# Patient Record
Sex: Female | Born: 1990 | Race: White | Hispanic: No | Marital: Married | Smoking: Never smoker
Health system: Southern US, Community
[De-identification: ages and names within clinical notes are randomized; demographics above are authoritative.]

## PROBLEM LIST (undated history)

## (undated) ENCOUNTER — Inpatient Hospital Stay (HOSPITAL_COMMUNITY): Payer: Self-pay

## (undated) DIAGNOSIS — Z8619 Personal history of other infectious and parasitic diseases: Secondary | ICD-10-CM

## (undated) DIAGNOSIS — N059 Unspecified nephritic syndrome with unspecified morphologic changes: Secondary | ICD-10-CM

## (undated) DIAGNOSIS — N189 Chronic kidney disease, unspecified: Secondary | ICD-10-CM

## (undated) DIAGNOSIS — I10 Essential (primary) hypertension: Secondary | ICD-10-CM

## (undated) DIAGNOSIS — K219 Gastro-esophageal reflux disease without esophagitis: Secondary | ICD-10-CM

## (undated) DIAGNOSIS — O139 Gestational [pregnancy-induced] hypertension without significant proteinuria, unspecified trimester: Secondary | ICD-10-CM

## (undated) HISTORY — PX: MOUTH SURGERY: SHX715

## (undated) HISTORY — DX: Personal history of other infectious and parasitic diseases: Z86.19

## (undated) HISTORY — DX: Gastro-esophageal reflux disease without esophagitis: K21.9

## (undated) HISTORY — PX: ELBOW SURGERY: SHX618

---
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
## 2011-11-11 ENCOUNTER — Emergency Department (HOSPITAL_COMMUNITY): Payer: Self-pay

## 2011-11-11 ENCOUNTER — Emergency Department (HOSPITAL_COMMUNITY)
Admission: EM | Admit: 2011-11-11 | Discharge: 2011-11-11 | Disposition: A | Discharge status: Home / Self Care | Payer: Self-pay | Attending: Emergency Medicine | Admitting: Emergency Medicine

## 2011-11-11 ENCOUNTER — Encounter (HOSPITAL_COMMUNITY): Payer: Self-pay | Admitting: Emergency Medicine

## 2011-11-11 DIAGNOSIS — B2790 Infectious mononucleosis, unspecified without complication: Secondary | ICD-10-CM | POA: Insufficient documentation

## 2011-11-11 LAB — CBC
HCT: 40.8 % (ref 36.0–46.0)
Hemoglobin: 14.8 g/dL (ref 12.0–15.0)
MCH: 31.9 pg (ref 26.0–34.0)
MCHC: 36.3 g/dL — ABNORMAL HIGH (ref 30.0–36.0)
MCV: 87.9 fL (ref 78.0–100.0)
RDW: 12.5 % (ref 11.5–15.5)

## 2011-11-11 LAB — DIFFERENTIAL
Basophils Relative: 4 % — ABNORMAL HIGH (ref 0–1)
Eosinophils Absolute: 0 10*3/uL (ref 0.0–0.7)
Lymphs Abs: 6.6 10*3/uL — ABNORMAL HIGH (ref 0.7–4.0)
Monocytes Absolute: 1.7 10*3/uL — ABNORMAL HIGH (ref 0.1–1.0)
Neutro Abs: 4.3 10*3/uL (ref 1.7–7.7)

## 2011-11-11 LAB — MONONUCLEOSIS SCREEN: Mono Screen: POSITIVE — AB

## 2011-11-11 MED ORDER — MAGIC MOUTHWASH: ORAL | Status: DC

## 2011-11-11 MED ORDER — HYDROCODONE-ACETAMINOPHEN 7.5-500 MG/15ML PO SOLN
10.0000 mL | Freq: Once | ORAL | Status: AC
  Administered 2011-11-11: 10 mL via ORAL
  Filled 2011-11-11: qty 15

## 2011-11-11 MED ORDER — IOHEXOL 300 MG/ML  SOLN
100.0000 mL | Freq: Once | INTRAMUSCULAR | Status: AC | PRN
  Administered 2011-11-11: 100 mL via INTRAVENOUS

## 2011-11-11 MED ORDER — HYDROCODONE-ACETAMINOPHEN 7.5-500 MG/15ML PO SOLN: ORAL | Status: DC

## 2011-11-11 NOTE — ED Notes (Signed)
Water given to pt 

## 2011-11-11 NOTE — ED Notes (Signed)
Pt c/o sore throat and throat edema x 1 week. Seen and given PCN, pt states it is not helping.

## 2011-11-12 NOTE — ED Provider Notes (Signed)
History     CSN: [id number]  Arrival date & time 11/11/11  1124   First MD Initiated Contact with Patient 11/11/11 1131      Chief Complaint  Patient presents with  . Abscess    (Consider location/radiation/quality/duration/timing/severity/associated sxs/prior treatment) HPI Comments: Patient c/o persistent sore throat and swollen glands to her neck for one week.  States she was seen at a local "minute clinic" at [facility] and told she had "strep throat" and started on PCN.  States she has taken it for several days but her symptoms are not improving.  She also c/o decreased appetite and fatigue.  She denies fever or vomting  Patient is a 21 y.o. female presenting with pharyngitis. The history is provided by the patient. No language interpreter was used.  Sore Throat This is a new problem. The current episode started 1 to 4 weeks ago. The problem occurs constantly. The problem has been gradually worsening. Associated symptoms include fatigue, neck pain, a sore throat and swollen glands. Pertinent negatives include no abdominal pain, arthralgias, chills, congestion, coughing, fever, headaches, nausea, rash, visual change, vomiting or weakness. The symptoms are aggravated by swallowing, drinking and eating. Treatments tried: oral antibiotics. The treatment provided no relief.    History reviewed. No pertinent past medical history.  Past Surgical History  Procedure Date  . Elbow surgery     Family History  Problem Relation Age of Onset  . Hypertension Mother     History  Substance Use Topics  . Smoking status: Never Smoker   . Smokeless tobacco: Never Used  . Alcohol Use: Yes     occas    OB History    Grav Para Term Preterm Abortions TAB SAB Ect Mult Living                  Review of Systems  Constitutional: Positive for appetite change and fatigue. Negative for fever and chills.  HENT: Positive for sore throat, trouble swallowing and neck pain. Negative for ear pain,  congestion, facial swelling, drooling, neck stiffness and dental problem.   Respiratory: Negative for cough.   Gastrointestinal: Negative for nausea, vomiting, abdominal pain and abdominal distention.  Musculoskeletal: Negative for arthralgias.  Skin: Negative.  Negative for rash.  Neurological: Negative for dizziness, weakness and headaches.  Hematological: Positive for adenopathy.  All other systems reviewed and are negative.    Allergies  Review of patient's allergies indicates no known allergies.  Home Medications   Current Outpatient Rx  Name Route Sig Dispense Refill  . PENICILLIN V POTASSIUM 500 MG PO TABS Oral Take 500 mg by mouth 3 (three) times daily. Started on 11/05/11 to take for 10 days.    Marland Kitchen MAGIC MOUTHWASH  5 ml po swish and spit TID prn 60 mL 0  . HYDROCODONE-ACETAMINOPHEN 7.5-500 MG/15ML PO SOLN  10 ml po q 4-6 hrs prn pain 150 mL 0    BP 131/90  Pulse 103  Temp(Src) 98 F (36.7 C) (Oral)  Resp 16  Ht 5\' 4"  (1.626 m)  Wt 172 lb (78.019 kg)  BMI 29.52 kg/m2  SpO2 99%  LMP 10/28/2011  Physical Exam  Nursing note and vitals reviewed. Constitutional: She is oriented to person, place, and time. She appears well-developed and well-nourished. No distress.  HENT:  Head: Normocephalic and atraumatic. No trismus in the jaw.  Right Ear: Tympanic membrane and ear canal normal.  Left Ear: Tympanic membrane and ear canal normal.  Mouth/Throat: Uvula is midline and mucous  membranes are normal. No uvula swelling or dental caries. Posterior oropharyngeal edema and posterior oropharyngeal erythema present. No oropharyngeal exudate.       Oropharyngeal erythema bilaterally with enlarged left tonsil and mild swelling or the left soft palate.    Neck: No thyromegaly present.  Cardiovascular: Normal rate, regular rhythm and normal heart sounds.   No murmur heard. Pulmonary/Chest: Effort normal and breath sounds normal. No stridor.  Abdominal: Soft. Normal appearance. She  exhibits no distension. There is no hepatosplenomegaly. There is no tenderness. There is no rebound.  Lymphadenopathy:    She has cervical adenopathy.       Right cervical: Superficial cervical and deep cervical adenopathy present. No posterior cervical adenopathy present.      Left cervical: Superficial cervical and deep cervical adenopathy present. No posterior cervical adenopathy present.       Right: No supraclavicular adenopathy present.       Left: No supraclavicular adenopathy present.  Neurological: She is alert and oriented to person, place, and time. She exhibits normal muscle tone. Coordination normal.  Skin: Skin is warm and dry.    ED Course  Procedures (including critical care time)  Labs Reviewed  CBC - Abnormal; Notable for the following:    WBC 13.1 (*)    MCHC 36.3 (*)    All other components within normal limits  DIFFERENTIAL - Abnormal; Notable for the following:    Neutrophils Relative 33 (*)    Lymphocytes Relative 50 (*)    Monocytes Relative 13 (*)    Basophils Relative 4 (*)    Lymphs Abs 6.6 (*)    Monocytes Absolute 1.7 (*)    Basophils Absolute 0.5 (*)    All other components within normal limits  MONONUCLEOSIS SCREEN - Abnormal; Notable for the following:    Mono Screen POSITIVE (*)    All other components within normal limits  LAB REPORT - SCANNED   Ct Soft Tissue Neck W Contrast  11/11/2011  *RADIOLOGY REPORT*  Clinical Data: Sore throat and edema for 1 week, worse on the left. No apparent response to antibiotics.  CT NECK WITH CONTRAST  Technique:  Multidetector CT imaging of the neck was performed with intravenous contrast.  Contrast: OMNIPAQUE IOHEXOL 300 MG/ML IV SOLN  Comparison: None.  Findings: Suprahyoid neck:  Enlarged left greater than right lingual tonsils.  The left lingual tonsil is larger than the right, measuring 18 x 24 x 39 mm.  No peritonsillar abscess.  Mild deformity of the airway but no impending compromise. Prominent but  symmetric nasopharyngeal adenoidal enlargement.  No evidence for eustachian tube dysfunction/mastoid fluid. The right submandibular gland appears slightly enlarged but I see no calculus.  Normal parotid glands.  No mucosal lesion.  Larynx:  Normal.  Infrahyoid neck:  Unremarkable thyroid.  No neck masses other than adenopathy.  Trachea midline.  Lymph nodes:  Widespread but relatively symmetric level I, II, III, IV and V adenopathy. There is one level II lymph node on the right with a short axis measurement of 11 mm, but most  display short axis measurements of 8 mm or less.  Upper chest/mediastinum:  No mediastinal masses or adenopathy. Normal great vessels.  Lung apices clear without infiltrate or nodule.  No visible effusion.  Additional:  Visualized intracranial compartment unremarkable.  No visible sinus disease skull base intact.  IMPRESSION: Findings consistent with significant tonsillitis, worst involving the left lingual tonsil.  No peritonsillar abscess or impending airway obstruction. No evidence for eustachian  tube dysfunction.  Widespread cervical lymphadenopathy with no apparent involvement of the mediastinum. These could be reactive from pharyngitis/tonsillitis, or might represent a different process such as mononucleosis. Neoplasm felt unlikely, but further investigation may be warranted if nodal enlargement persists or progresses.  Slight chronic submandibular gland  enlargement without visible calculi; significance uncertain.  Original Report Authenticated By: Elsie Stain, M.D.     1. Mononucleosis       MDM     Patient has significantly enlarged, bilateral anterior cervical lymph nodes. Erythema and edema of the left tonsil with mild edema of the peritonsillar region.  Handle own secretions w/o difficulty, no trismus.  Non-toxic appearing.    1150  Patient was seen by EDP and care plan was discussed.  Will order CT scan of neck for possible peritonsillar abscess.    Patient is  feeling better, pain improved.  HAs drank fluids w/o difficulty.  I have advised her to avoid abdominal injury, rest,fluids and f/u with her PMD.  She agrees to care plan.  Patient / Family / Caregiver understand and agree with initial ED impression and plan with expectations set for ED visit.   Pt feels improved after observation and/or treatment in ED.   Pt stable in ED with no significant deterioration in condition.        Twain [address], [place] 11/12/11 2052

## 2011-11-17 NOTE — ED Provider Notes (Signed)
Medical screening examination/treatment/procedure(s) were performed by non-physician practitioner and as supervising physician I was immediately available for consultation/collaboration.  Nicoletta Dress. Colon Branch, MD 11/17/11 1010

## 2014-10-27 NOTE — L&D Delivery Note (Signed)
SVD of VMI at 2107 on 08/20/15.  APGARs 9,9.  EBL 350cc.  Placenta to L&D. Head delivered LOA and body followed atraumatically.  Baby to abdomen.  Cord was clamped and cut.  Cord blood was obtained.  Placenta delivered S/I/3VC. Fundus was firmed with pitocin and massage.  Vaginal laceration and bilateral periurethral lacs were repaired with 3-0 Rapide in the normal fashion.  Mom and baby stable.  Mitchel HonourMegan Kimiah Hibner, DO

## 2015-01-12 ENCOUNTER — Encounter: Payer: Self-pay | Admitting: Adult Health

## 2015-02-05 LAB — OB RESULTS CONSOLE RUBELLA ANTIBODY, IGM: Rubella: IMMUNE

## 2015-02-05 LAB — OB RESULTS CONSOLE ABO/RH: RH TYPE: POSITIVE

## 2015-02-05 LAB — OB RESULTS CONSOLE HIV ANTIBODY (ROUTINE TESTING): HIV: NONREACTIVE

## 2015-02-05 LAB — OB RESULTS CONSOLE RPR: RPR: NONREACTIVE

## 2015-02-05 LAB — OB RESULTS CONSOLE HEPATITIS B SURFACE ANTIGEN: Hepatitis B Surface Ag: NEGATIVE

## 2015-02-05 LAB — OB RESULTS CONSOLE ANTIBODY SCREEN: Antibody Screen: NEGATIVE

## 2015-02-06 ENCOUNTER — Inpatient Hospital Stay (HOSPITAL_COMMUNITY): Admission: AD | Admit: 2015-02-06 | Payer: Self-pay | Source: Ambulatory Visit | Admitting: Obstetrics and Gynecology

## 2015-04-17 LAB — OB RESULTS CONSOLE GC/CHLAMYDIA
Chlamydia: POSITIVE
Gonorrhea: NEGATIVE

## 2015-06-26 ENCOUNTER — Encounter (HOSPITAL_COMMUNITY): Payer: Self-pay | Admitting: *Deleted

## 2015-06-26 ENCOUNTER — Inpatient Hospital Stay (HOSPITAL_COMMUNITY)
Admission: AD | Admit: 2015-06-26 | Discharge: 2015-06-26 | Disposition: A | Discharge status: Home / Self Care | Payer: PRIVATE HEALTH INSURANCE | Source: Ambulatory Visit | Attending: Obstetrics and Gynecology | Admitting: Obstetrics and Gynecology

## 2015-06-26 DIAGNOSIS — N949 Unspecified condition associated with female genital organs and menstrual cycle: Secondary | ICD-10-CM | POA: Diagnosis not present

## 2015-06-26 DIAGNOSIS — Z3A29 29 weeks gestation of pregnancy: Secondary | ICD-10-CM | POA: Insufficient documentation

## 2015-06-26 DIAGNOSIS — R102 Pelvic and perineal pain: Secondary | ICD-10-CM | POA: Diagnosis not present

## 2015-06-26 DIAGNOSIS — O26893 Other specified pregnancy related conditions, third trimester: Secondary | ICD-10-CM | POA: Diagnosis not present

## 2015-06-26 DIAGNOSIS — R109 Unspecified abdominal pain: Secondary | ICD-10-CM | POA: Diagnosis present

## 2015-06-26 LAB — URINALYSIS, ROUTINE W REFLEX MICROSCOPIC
BILIRUBIN URINE: NEGATIVE
GLUCOSE, UA: NEGATIVE mg/dL
Ketones, ur: NEGATIVE mg/dL
Leukocytes, UA: NEGATIVE
NITRITE: NEGATIVE
PH: 7 (ref 5.0–8.0)
Protein, ur: 100 mg/dL — AB
SPECIFIC GRAVITY, URINE: 1.01 (ref 1.005–1.030)
Urobilinogen, UA: 0.2 mg/dL (ref 0.0–1.0)

## 2015-06-26 LAB — URINE MICROSCOPIC-ADD ON

## 2015-06-26 NOTE — MAU Provider Note (Signed)
History     CSN: [id number]  Arrival date and time: 06/26/15 2131   First Provider Initiated Contact with Patient 06/26/15 2232      No chief complaint on file.  HPI  Meghan Calhoun is a 24 y.o. G1P0 at [redacted]w[redacted]d who presents to MAU today with complaint of lower abdominal tightening and mucus discharge. She states that she notes the tightening 2-3 times per hour with standing and/or ambulation. She does not feel much pain or tightening when resting. She denies vaginal bleeding or LOF. She does report a mucus discharge x 2-3 days. She denies foul odor or color. She denies complications with the pregnancy. She reports good fetal movement.   OB History    Gravida Para Term Preterm AB TAB SAB Ectopic Multiple Living   1               History reviewed. No pertinent past medical history.  Past Surgical History  Procedure Laterality Date  . Elbow surgery    . Mouth surgery      History reviewed. No pertinent family history.  Social History  Substance Use Topics  . Smoking status: Never Smoker   . Smokeless tobacco: None  . Alcohol Use: No    Allergies: No Known Allergies  No prescriptions prior to admission    Review of Systems  Constitutional: Negative for fever and malaise/fatigue.  Gastrointestinal: Negative for nausea, vomiting, abdominal pain, diarrhea and constipation.  Genitourinary: Negative for dysuria, urgency and frequency.       Neg - vaginal bleeding, abnormal discharge, LOF   Physical Exam   Blood pressure 122/77, pulse 95, temperature 98.4 F (36.9 C), temperature source Oral, resp. rate 16, height  (1.651 m), weight 186 lb (84.369 kg), SpO2 100 %.  Physical Exam  Nursing note and vitals reviewed. Constitutional: She is oriented to person, place, and time. She appears well-developed and well-nourished. No distress.  HENT:  Head: Normocephalic and atraumatic.  Cardiovascular: Normal rate.   Respiratory: Effort normal.  GI: Soft. She exhibits no  distension and no mass. There is no tenderness. There is no rebound and no guarding.  Neurological: She is alert and oriented to person, place, and time.  Skin: Skin is warm and dry. No erythema.  Psychiatric: She has a normal mood and affect.  Dilation: Closed Effacement (%): Thick Station: Ballotable Exam by:: Harlon Flor, PA-C  Results for orders placed or performed during the hospital encounter of 06/26/15 (from the past 24 hour(s))  Urinalysis, Routine w reflex microscopic (not at [facility])     Status: Abnormal   Collection Time: 06/26/15 10:42 PM  Result Value Ref Range   Color, Urine YELLOW YELLOW   APPearance CLEAR CLEAR   Specific Gravity, Urine 1.010 1.005 - 1.030   pH 7.0 5.0 - 8.0   Glucose, UA NEGATIVE NEGATIVE mg/dL   Hgb urine dipstick TRACE (A) NEGATIVE   Bilirubin Urine NEGATIVE NEGATIVE   Ketones, ur NEGATIVE NEGATIVE mg/dL   Protein, ur 409 (A) NEGATIVE mg/dL   Urobilinogen, UA 0.2 0.0 - 1.0 mg/dL   Nitrite NEGATIVE NEGATIVE   Leukocytes, UA NEGATIVE NEGATIVE  Urine microscopic-add on     Status: Abnormal   Collection Time: 06/26/15 10:42 PM  Result Value Ref Range   Squamous Epithelial / LPF FEW (A) RARE   WBC, UA 3-6 <3 WBC/hpf   Bacteria, UA FEW (A) RARE    Fetal Monitoring: Baseline: 140 bpm, moderate variability, + accelerations, one variable deceleration noted  in 40 minutes Contractions: none  MAU Course  Procedures None  MDM UA today Discussed with Dr. Henderson Cloud. Agrees with plan for counseling on round ligament pain and discharge at this time. Follow-up as scheduled.   Assessment and Plan  A: [facility]UP at [redacted]w[redacted]d Round ligament pain  P: Discharge home Tylenol advised PRN for pain Discussed abdominal binder and warm bath/shower for pain Preterm labor precautions discussed Patient advised to follow-up with [facility] as scheduled for routine prenatal care or sooner PRN Patient may return to MAU as needed or if her condition were to change  or worsen   Marny Lowenstein, PA-C  06/26/2015, 11:19 PM

## 2015-06-26 NOTE — Discharge Instructions (Signed)

## 2015-06-26 NOTE — MAU Note (Signed)
E Signature page not working in room 3.  Patient signed paper copy.

## 2015-06-26 NOTE — MAU Note (Signed)
Pt reports for the last 2-3 days she has had a mucous and ? Contractions.

## 2015-07-05 ENCOUNTER — Encounter (HOSPITAL_COMMUNITY): Payer: Self-pay

## 2015-07-05 ENCOUNTER — Encounter (HOSPITAL_COMMUNITY): Payer: Self-pay | Admitting: Emergency Medicine

## 2015-07-11 ENCOUNTER — Observation Stay (HOSPITAL_COMMUNITY)
Admission: AD | Admit: 2015-07-11 | Discharge: 2015-07-13 | Disposition: A | Discharge status: Home / Self Care | Payer: PRIVATE HEALTH INSURANCE | Source: Ambulatory Visit | Attending: Obstetrics & Gynecology | Admitting: Obstetrics & Gynecology

## 2015-07-11 ENCOUNTER — Encounter (HOSPITAL_COMMUNITY): Payer: Self-pay | Admitting: *Deleted

## 2015-07-11 DIAGNOSIS — O1403 Mild to moderate pre-eclampsia, third trimester: Secondary | ICD-10-CM | POA: Diagnosis not present

## 2015-07-11 DIAGNOSIS — O1493 Unspecified pre-eclampsia, third trimester: Secondary | ICD-10-CM | POA: Diagnosis present

## 2015-07-11 DIAGNOSIS — Z3A31 31 weeks gestation of pregnancy: Secondary | ICD-10-CM | POA: Insufficient documentation

## 2015-07-11 DIAGNOSIS — O1490 Unspecified pre-eclampsia, unspecified trimester: Secondary | ICD-10-CM | POA: Diagnosis present

## 2015-07-11 LAB — COMPREHENSIVE METABOLIC PANEL
ALBUMIN: 1.7 g/dL — AB (ref 3.5–5.0)
ALK PHOS: 91 U/L (ref 38–126)
ALT: 13 U/L — ABNORMAL LOW (ref 14–54)
ANION GAP: 11 (ref 5–15)
AST: 18 U/L (ref 15–41)
BUN: 6 mg/dL (ref 6–20)
CO2: 21 mmol/L — AB (ref 22–32)
Calcium: 7.6 mg/dL — ABNORMAL LOW (ref 8.9–10.3)
Chloride: 99 mmol/L — ABNORMAL LOW (ref 101–111)
Creatinine, Ser: 0.45 mg/dL (ref 0.44–1.00)
GFR calc Af Amer: >60 mL/min (ref >60)
GFR calc non Af Amer: >60 mL/min (ref >60)
GLUCOSE: 92 mg/dL (ref 65–99)
POTASSIUM: 3.4 mmol/L — AB (ref 3.5–5.1)
SODIUM: 131 mmol/L — AB (ref 135–145)
Total Bilirubin: 0.3 mg/dL (ref 0.3–1.2)
Total Protein: 5.9 g/dL — ABNORMAL LOW (ref 6.5–8.1)

## 2015-07-11 LAB — PROTEIN / CREATININE RATIO, URINE
Creatinine, Urine: 109 mg/dL
PROTEIN CREATININE RATIO: 14.25 mg/mg{creat} — AB (ref 0.00–0.15)
Total Protein, Urine: 1553 mg/dL

## 2015-07-11 LAB — URINALYSIS, ROUTINE W REFLEX MICROSCOPIC
Bilirubin Urine: NEGATIVE
Glucose, UA: NEGATIVE mg/dL
Ketones, ur: 40 mg/dL — AB
LEUKOCYTES UA: NEGATIVE
NITRITE: NEGATIVE
Specific Gravity, Urine: 1.02 (ref 1.005–1.030)
UROBILINOGEN UA: 0.2 mg/dL (ref 0.0–1.0)
pH: 7.5 (ref 5.0–8.0)

## 2015-07-11 LAB — CBC
HCT: 36.1 % (ref 36.0–46.0)
HEMOGLOBIN: 13.1 g/dL (ref 12.0–15.0)
MCH: 32.8 pg (ref 26.0–34.0)
MCHC: 36.3 g/dL — AB (ref 30.0–36.0)
MCV: 90.5 fL (ref 78.0–100.0)
Platelets: 253 10*3/uL (ref 150–400)
RBC: 3.99 MIL/uL (ref 3.87–5.11)
RDW: 13.1 % (ref 11.5–15.5)
WBC: 12.7 10*3/uL — AB (ref 4.0–10.5)

## 2015-07-11 LAB — FETAL FIBRONECTIN: FETAL FIBRONECTIN: NEGATIVE

## 2015-07-11 LAB — URINE MICROSCOPIC-ADD ON

## 2015-07-11 MED ORDER — BETAMETHASONE SOD PHOS & ACET 6 (3-3) MG/ML IJ SUSP
12.0000 mg | INTRAMUSCULAR | Status: AC
  Administered 2015-07-11 – 2015-07-12 (×2): 12 mg via INTRAMUSCULAR
  Filled 2015-07-11 (×2): qty 2

## 2015-07-11 MED ORDER — CALCIUM CARBONATE ANTACID 500 MG PO CHEW: 2.0000 | CHEWABLE_TABLET | ORAL | Status: DC | PRN

## 2015-07-11 MED ORDER — DOCUSATE SODIUM 100 MG PO CAPS
100.0000 mg | ORAL_CAPSULE | Freq: Every day | ORAL | Status: DC
  Administered 2015-07-12: 100 mg via ORAL
  Filled 2015-07-11: qty 1

## 2015-07-11 MED ORDER — TERBUTALINE SULFATE 1 MG/ML IJ SOLN
0.2500 mg | Freq: Once | INTRAMUSCULAR | Status: AC
  Administered 2015-07-11: 0.25 mg via SUBCUTANEOUS
  Filled 2015-07-11: qty 1

## 2015-07-11 MED ORDER — ACETAMINOPHEN 325 MG PO TABS: 650.0000 mg | ORAL_TABLET | ORAL | Status: DC | PRN

## 2015-07-11 MED ORDER — PRENATAL MULTIVITAMIN CH
1.0000 | ORAL_TABLET | Freq: Every day | ORAL | Status: DC
  Administered 2015-07-12: 1 via ORAL
  Filled 2015-07-11: qty 1

## 2015-07-11 MED ORDER — ZOLPIDEM TARTRATE 5 MG PO TABS: 5.0000 mg | ORAL_TABLET | Freq: Every evening | ORAL | Status: DC | PRN

## 2015-07-11 NOTE — H&P (Signed)
Meghan Calhoun is a 24 y.o. female presenting to MAU for evaluation of contractions.  The patient reports nausea, vomiting, and diarrhea today.  She started feeling CTX this pm and was concerned for labor but her cvx was closed with neg FFN.  The patient's only antepartum issue has been the development of proteinuria over the last few weeks (3+); negative urine cx.  The patient's BPs have been normal until tonight in MAU where they were mostly mild range.  Preeclampsia labs wnl but P:C 14.  The patient denies HA, CP/SOB, RUQ pain, and visual disturbance.  Active FM.  CTX are better after receiving one dose of terbutaline.  Maternal Medical History:  Reason for admission: Contractions and nausea.   Contractions: Onset was 6-12 hours ago.   Frequency: irregular.   Perceived severity is mild.    Fetal activity: Perceived fetal activity is normal.   Last perceived fetal movement was within the past hour.    Prenatal complications: Pre-eclampsia.   Prenatal Complications - Diabetes: none.    OB History    Gravida Para Term Preterm AB TAB SAB Ectopic Multiple Living   1 0 0 0 0 0 0 0       History reviewed. No pertinent past medical history. Past Surgical History  Procedure Laterality Date  . Elbow surgery    . Mouth surgery     Family History: family history includes Hypertension in her mother. Social History:  reports that she has never smoked. She does not have any smokeless tobacco history on file. She reports that she does not drink alcohol or use illicit drugs.   Prenatal Transfer Tool  Maternal Diabetes: No Genetic Screening: Normal Maternal Ultrasounds/Referrals: Normal Fetal Ultrasounds or other Referrals:  None Maternal Substance Abuse:  No Significant Maternal Medications:  None Significant Maternal Lab Results:  None Other Comments:  None  Review of Systems  Constitutional: Negative for fever and chills.  Eyes: Negative for blurred vision and double vision.   Respiratory: Negative for shortness of breath.   Cardiovascular: Positive for leg swelling. Negative for chest pain and palpitations.  Gastrointestinal: Positive for nausea, vomiting, abdominal pain and diarrhea.  Neurological: Negative for dizziness and headaches.    Dilation: Closed Effacement (%): 20 Station: [place], -3 Exam by:: Artelia Laroche, CNM Blood pressure 143/91, pulse 116, temperature 98.7 F (37.1 C), temperature source Oral, resp. rate 18, height  (1.651 m), weight 186 lb (84.369 kg), SpO2 100 %. Maternal Exam:  Abdomen: Patient reports no abdominal tenderness. Fundal height is c/w dates.   Estimated fetal weight is 4#.       Physical Exam  Constitutional: She is oriented to person, place, and time. She appears well-developed and well-nourished.  GI: Soft. There is no rebound and no guarding.  Musculoskeletal: She exhibits edema.  Neurological: She is alert and oriented to person, place, and time.  Skin: Skin is warm and dry.  Psychiatric: She has a normal mood and affect. Her behavior is normal.  Ext: 1+DTRs, no clonus, 1+edema in LLE   Prenatal labs: ABO, Rh: B/Positive/-- (04/11 0000) Antibody: Negative (04/11 0000) Rubella: Immune (04/11 0000) RPR: Nonreactive (04/11 0000)  HBsAg: Negative (04/11 0000)  HIV: Non-reactive (04/11 0000)  GBS:     Assessment/Plan: 24yo G1 at [redacted]w[redacted]d with pre-eclampsia without severe features -BMZ -Serial BPs -Repeat labs in AM -MFM U/S and consult in AM -Collect 24 hour urine -Monitor for development of severe features  Meghan Calhoun 07/11/2015, 9:54 PM

## 2015-07-11 NOTE — MAU Note (Signed)
Sudden onset of pain all over abdomen around 1530, then vomited X 1. Pain less after that but has moved to her back. Vomited X 1 yesterday. Some loose stools past 3 days. Denies cramping or pain with that.

## 2015-07-11 NOTE — MAU Provider Note (Signed)
History     CSN: [id number]  Arrival date and time: 07/11/15 1749   First Provider Initiated Contact with Patient 07/11/15 1839      Chief Complaint  Patient presents with  . Abdominal Pain   This is a 24 y.o. female at [redacted]w[redacted]d who presents with c/o contractions since this afternoon. Denies leaking or bleeding. Reports + FM. Told RN she had vomiting and loose stools. I informed her of elevated BPs today and she states they have been watching her for elevated proteinuria "lately".  But has had no hypertension until today. Denies headache or blurred vision.   Patient is a 24 y.o. female presenting with abdominal pain and hypertension. The history is provided by the patient.  Abdominal Pain The primary symptoms of the illness include abdominal pain, vomiting and diarrhea (not really diarrhea, but loose). The primary symptoms of the illness do not include fever, fatigue, nausea, dysuria, vaginal discharge or vaginal bleeding.  The patient states that she believes she is currently pregnant. Additional symptoms associated with the illness include back pain. Symptoms associated with the illness do not include chills, anorexia, heartburn, constipation, urgency, hematuria or frequency.  Hypertension Associated symptoms include abdominal pain and vomiting. Pertinent negatives include no anorexia, chills, fatigue, fever or nausea.   RN Note: Sudden onset of pain all over abdomen around 1530, then vomited X 1. Pain less after that but has moved to her back. Vomited X 1 yesterday. Some loose stools past 3 days. Denies cramping or pain with that.          OB History    Gravida Para Term Preterm AB TAB SAB Ectopic Multiple Living   1 0 0 0 0 0 0 0        History reviewed. No pertinent past medical history.  Past Surgical History  Procedure Laterality Date  . Elbow surgery    . Mouth surgery      Family History  Problem Relation Age of Onset  . Hypertension Mother     Social History   Substance Use Topics  . Smoking status: Never Smoker   . Smokeless tobacco: None  . Alcohol Use: No     Comment: occas    Allergies: No Known Allergies  Prescriptions prior to admission  Medication Sig Dispense Refill Last Dose  . Alum & Mag Hydroxide-Simeth (MAGIC MOUTHWASH) SOLN 5 ml po swish and spit TID prn 60 mL 0   . HYDROcodone-acetaminophen (LORTAB) 7.5-500 MG/15ML solution 10 ml po q 4-6 hrs prn pain 150 mL 0   . penicillin v potassium (VEETID) 500 MG tablet Take 500 mg by mouth 3 (three) times daily. Started on 11/05/11 to take for 10 days.   11/10/2011  . Prenatal Vit-Fe Fumarate-FA (PRENATAL MULTIVITAMIN) TABS tablet Take 1 tablet by mouth daily at 12 noon.   06/26/2015 at Unknown time   Medical, Surgical, Family and Social histories reviewed and are listed above.  Medications and allergies reviewed.   Review of Systems  Constitutional: Negative for fever, chills, malaise/fatigue and fatigue.  Gastrointestinal: Positive for vomiting, abdominal pain and diarrhea (not really diarrhea, but loose). Negative for heartburn, nausea, constipation and anorexia.  Genitourinary: Negative for dysuria, urgency, frequency, hematuria, vaginal bleeding and vaginal discharge.  Musculoskeletal: Positive for back pain.  Neurological: Negative for focal weakness.  Other systems negative  Physical Exam   Blood pressure 141/92, pulse 110, temperature 98.1 F (36.7 C), temperature source Oral, resp. rate 18.  Physical Exam  Constitutional: She is  oriented to person, place, and time. She appears well-developed and well-nourished. No distress.  Cardiovascular: Regular rhythm.   No murmur (mild tachycardia) heard. Respiratory: Effort normal. No respiratory distress.  GI: Soft.  Genitourinary:  Dilation: Closed Effacement (%): 20 Station: [place], -3 Exam by:: Artelia Laroche, CNM  FHR reactive Uterine irritability with contractions every 1.5-2 min   Musculoskeletal: Normal range of  motion. She exhibits no edema.  Neurological: She is alert and oriented to person, place, and time. She has normal reflexes. She exhibits normal muscle tone.  Skin: Skin is warm and dry.  Psychiatric: She has a normal mood and affect.   Results for orders placed or performed during the hospital encounter of 07/11/15 (from the past 24 hour(s))  Urinalysis, Routine w reflex microscopic (not at [facility])     Status: Abnormal   Collection Time: 07/11/15  6:04 PM  Result Value Ref Range   Color, Urine YELLOW YELLOW   APPearance CLEAR CLEAR   Specific Gravity, Urine 1.020 1.005 - 1.030   pH 7.5 5.0 - 8.0   Glucose, UA NEGATIVE NEGATIVE mg/dL   Hgb urine dipstick TRACE (A) NEGATIVE   Bilirubin Urine NEGATIVE NEGATIVE   Ketones, ur 40 (A) NEGATIVE mg/dL   Protein, ur >409 (A) NEGATIVE mg/dL   Urobilinogen, UA 0.2 0.0 - 1.0 mg/dL   Nitrite NEGATIVE NEGATIVE   Leukocytes, UA NEGATIVE NEGATIVE  Protein / creatinine ratio, urine     Status: Abnormal   Collection Time: 07/11/15  6:04 PM  Result Value Ref Range   Creatinine, Urine 109.00 mg/dL   Total Protein, Urine 1553 mg/dL   Protein Creatinine Ratio 14.25 (H) 0.00 - 0.15 mg/mg[Cre]  Urine microscopic-add on     Status: Abnormal   Collection Time: 07/11/15  6:04 PM  Result Value Ref Range   Squamous Epithelial / LPF FEW (A) RARE   WBC, UA 3-6 <3 WBC/hpf   RBC / HPF 0-2 <3 RBC/hpf   Bacteria, UA FEW (A) RARE   Casts HYALINE CASTS (A) NEGATIVE  Fetal fibronectin     Status: None   Collection Time: 07/11/15  6:50 PM  Result Value Ref Range   Fetal Fibronectin NEGATIVE NEGATIVE  CBC     Status: Abnormal   Collection Time: 07/11/15  7:15 PM  Result Value Ref Range   WBC 12.7 (H) 4.0 - 10.5 K/uL   RBC 3.99 3.87 - 5.11 MIL/uL   Hemoglobin 13.1 12.0 - 15.0 g/dL   HCT 81.1 91.4 - 78.2 %   MCV 90.5 78.0 - 100.0 fL   MCH 32.8 26.0 - 34.0 pg   MCHC 36.3 (H) 30.0 - 36.0 g/dL   RDW 95.6 21.3 - 08.6 %   Platelets 253 150 - 400 K/uL   Comprehensive metabolic panel     Status: Abnormal   Collection Time: 07/11/15  7:15 PM  Result Value Ref Range   Sodium 131 (L) 135 - 145 mmol/L   Potassium 3.4 (L) 3.5 - 5.1 mmol/L   Chloride 99 (L) 101 - 111 mmol/L   CO2 21 (L) 22 - 32 mmol/L   Glucose, Bld 92 65 - 99 mg/dL   BUN 6 6 - 20 mg/dL   Creatinine, Ser 5.78 0.44 - 1.00 mg/dL   Calcium 7.6 (L) 8.9 - 10.3 mg/dL   Total Protein 5.9 (L) 6.5 - 8.1 g/dL   Albumin 1.7 (L) 3.5 - 5.0 g/dL   AST 18 15 - 41 U/L   ALT 13 (L) 14 -  54 U/L   Alkaline Phosphatase 91 38 - 126 U/L   Total Bilirubin 0.3 0.3 - 1.2 mg/dL   GFR calc non Af Amer >60 >60 mL/min   GFR calc Af Amer >60 >60 mL/min   Anion gap 11 5 - 15    MAU Course  Procedures  MDM Consulted Dr Langston Masker with presentation, symptoms, exam findings. She ordered Terbutaline, FFn, and PIH labs.  Fetal fibronectin sent to rule out Preterm labor   >>>   Negative  Terbutaline ordered  >> good cessation of contractions  CBC, CMET, and Pr/Creat Ratio ordered to rule out preeclampsia  2023: D/W Dr. Langston Masker, will obs for tonight and give BMZ    Assessment and Plan  A:  SIUP at [redacted]w[redacted]d        Preterm uterine contractions with negative fetal fibronectin and resolution of contractions       Gestational hypertension, rule out preeclampsia        Very high protein creatinine ratio       Reactive fetal heart rate tracing  P:  Consulted Dr Langston Masker re: exam findings and results. She recommends admission       Transfer to antenatal unit for Observation, 24 hr Urine and steroids       Preeclampsia precautions  Tawnya Crook  8:27 PM 07/11/2015   Wynelle Bourgeois 07/11/2015, 6:58 PM

## 2015-07-12 ENCOUNTER — Observation Stay (HOSPITAL_COMMUNITY): Payer: PRIVATE HEALTH INSURANCE

## 2015-07-12 DIAGNOSIS — O1403 Mild to moderate pre-eclampsia, third trimester: Secondary | ICD-10-CM | POA: Diagnosis not present

## 2015-07-12 LAB — CBC
HEMATOCRIT: 37.3 % (ref 36.0–46.0)
Hemoglobin: 13.4 g/dL (ref 12.0–15.0)
MCH: 32.6 pg (ref 26.0–34.0)
MCHC: 35.9 g/dL (ref 30.0–36.0)
MCV: 90.8 fL (ref 78.0–100.0)
PLATELETS: 266 10*3/uL (ref 150–400)
RBC: 4.11 MIL/uL (ref 3.87–5.11)
RDW: 13 % (ref 11.5–15.5)
WBC: 8.2 10*3/uL (ref 4.0–10.5)

## 2015-07-12 LAB — COMPREHENSIVE METABOLIC PANEL
ALBUMIN: 1.7 g/dL — AB (ref 3.5–5.0)
ALT: 13 U/L — AB (ref 14–54)
AST: 18 U/L (ref 15–41)
Alkaline Phosphatase: 92 U/L (ref 38–126)
Anion gap: 10 (ref 5–15)
BILIRUBIN TOTAL: 0.5 mg/dL (ref 0.3–1.2)
BUN: 6 mg/dL (ref 6–20)
CHLORIDE: 101 mmol/L (ref 101–111)
CO2: 24 mmol/L (ref 22–32)
CREATININE: 0.47 mg/dL (ref 0.44–1.00)
Calcium: 8.5 mg/dL — ABNORMAL LOW (ref 8.9–10.3)
GFR calc Af Amer: >60 mL/min (ref >60)
GLUCOSE: 121 mg/dL — AB (ref 65–99)
POTASSIUM: 4.7 mmol/L (ref 3.5–5.1)
Sodium: 135 mmol/L (ref 135–145)
Total Protein: 6.2 g/dL — ABNORMAL LOW (ref 6.5–8.1)

## 2015-07-12 NOTE — Progress Notes (Signed)
Patient ID: Meghan Calhoun, female   DOB: 1991/06/20, 24 y.o.   MRN: [id number] S: NO PIH SX'S  NAUSEA AND VOMITTING RESOLVED O: BP 141/93       DTR'S 1+       GOOD FM       PIH LABS OK A: IUP AT 31.3 WITH MILD PRE-ECLAMPSIA P: SONO WITH MFM     FINISH BMZ     COMPLETE 24 HR URINE

## 2015-07-12 NOTE — Consult Note (Signed)
MATERNAL FETAL MEDICINE CONSULT  Patient Name: Meghan Calhoun Medical Record Number:  [id number] Date of Birth: 15-May-1991 Requesting Physician Name:  Mitchel Honour, DO Date of Service: 07/12/2015  Chief Complaint Elevated blood pressure  History of Present Illness Meghan Calhoun was seen today secondary to elevated BP at the request of Mitchel Honour, DO.  The patient is a 24 y.o. G1P0000,at [redacted]w[redacted]d with an EDD of 09/10/2015, Date entered prior to episode creation dating method.  She presented to the hospital for evaluation for contractions and was found to have elevated BPs.  Subsequent work up demonstrated a protein/creatinine ratio of 14.25, but no other abnormalities.  She denies headache, visual changes, RUQ pain, or an acute change in edema.  Her contractions have subsided.  She denies vaginal bleeding or loss of fluid.  She has no history of hypertension.  Review of Systems Pertinent items are noted in HPI.  Patient History OB History  Gravida Para Term Preterm AB SAB TAB Ectopic Multiple Living  1 0 0 0 0 0 0 0      # Outcome Date GA Lbr Len/2nd Weight Sex Delivery Anes PTL Lv  1 Current               History reviewed. No pertinent past medical history.  Past Surgical History  Procedure Laterality Date  . Elbow surgery    . Mouth surgery      Social History   Social History  . Marital Status: Married    Spouse Name: N/A  . Number of Children: N/A  . Years of Education: N/A   Social History Main Topics  . Smoking status: Never Smoker   . Smokeless tobacco: None  . Alcohol Use: No     Comment: occas  . Drug Use: No  . Sexual Activity: Yes    Birth Control/ Protection: None   Other Topics Concern  . None   Social History Narrative   ** Merged History Encounter **        Family History  Problem Relation Age of Onset  . Hypertension Mother    In addition, the patient has no family history of mental retardation, birth defects, or genetic diseases.  Physical  Examination Filed Vitals:   07/12/15 1209  BP: 143/90  Pulse: 100  Temp: 97.9 F (36.6 C)  Resp: 18   General appearance - alert, well appearing, and in no distress Abdomen - soft, nontender, nondistended, no masses or organomegaly Extremities - pedal edema 1 +  Assessment and Recommendations 1.  Gestational hypertension with suspected preeclampsia.  Meghan Calhoun clearly has abnormally high blood pressures.  Fortunately, they are all within the mild range.  In addition, she has no symptoms or laboratory abnormalities to suggest severe features.  Despite the very high protein/creatinine ratio of 14 Meghan Calhoun has only mild peripheral edema.  This raises the possibility of a spurious lab value.  I would confirm the presence of significant proteinuria with the 24 hour urine collection that is in process.  This is mostly an academic issue as Meghan Calhoun should be managed the same whether she has gestational hypertension or preeclampsia.  She can be expectantly managed as she has no severe features.  I recommend twice weekly fetal testing, once weekly preeclampsia labs, growth scans every 4 weeks, and delivery between 37-38 weeks.  I spent 20 minutes with Meghan Calhoun today of which 50% was face-to-face counseling.  Thank you for referring Meghan Calhoun to the [facility].  Please do not  hesitate to contact us with questions.   Rema Fendt, MD

## 2015-07-13 ENCOUNTER — Encounter (HOSPITAL_COMMUNITY)
Admission: AD | Disposition: A | Discharge status: Home / Self Care | Payer: Self-pay | Source: Ambulatory Visit | Attending: Obstetrics & Gynecology

## 2015-07-13 DIAGNOSIS — O1493 Unspecified pre-eclampsia, third trimester: Secondary | ICD-10-CM | POA: Diagnosis not present

## 2015-07-13 DIAGNOSIS — O1403 Mild to moderate pre-eclampsia, third trimester: Secondary | ICD-10-CM | POA: Diagnosis not present

## 2015-07-13 LAB — PROTEIN, URINE, 24 HOUR
Collection Interval-UPROT: 24 hours
PROTEIN, 24H URINE: 18659 mg/d — AB (ref 50–100)
PROTEIN, URINE: 1357 mg/dL
URINE TOTAL VOLUME-UPROT: 1375 mL

## 2015-07-13 SURGERY — Surgical Case
Anesthesia: Regional

## 2015-07-13 NOTE — OB Triage Note (Signed)
Discharge instructions given and discussed with patient. Patient verbalized understanding and all questions answered. Pt. Discharged home with mother-in-law.

## 2015-07-13 NOTE — Plan of Care (Signed)
Problem: Consults Goal: Birthing Suites Patient Information Press F2 to bring up selections list  Outcome: Completed/Met Date Met:  07/13/15  PIH (Pregnancy induced hypertension) and Antenatal Patient (< 37 weeks)

## 2015-07-13 NOTE — Discharge Summary (Signed)
  Admission Diagnosis: IUP at 31 weeks Preeclampsia  Discharge Diagnosis: Same  Hospital Course: 24 year old G 1 P 0 at 32 w 4 days presents to hospital for evaluation of hypertension.  Patient was admitted and was given steroids for fetal lung maturity.  She had labs which revealed normal liver function tests and normal platelet count.  Her 24 hour protein was 18 grams which classified her as preeclampsia.  Yesterday she had a consultation with MFM and they recommended bedrest as outpatient with close monitoring in our office 2 times per week.  Her blood pressure has been well controlled during her hospitalization.  She has responded to bedrest.  She was discharged home today in stable condition. She was given very strict Preeclampsia precautions. She will follow up on Monday for NST and [facility] labs. She will follow strict bedrest at home and monitor weights daily.

## 2015-08-02 ENCOUNTER — Encounter (HOSPITAL_COMMUNITY): Payer: Self-pay | Admitting: *Deleted

## 2015-08-02 ENCOUNTER — Inpatient Hospital Stay (HOSPITAL_COMMUNITY)
Admission: AD | Admit: 2015-08-02 | Discharge: 2015-08-02 | Disposition: A | Discharge status: Home / Self Care | Payer: PRIVATE HEALTH INSURANCE | Source: Ambulatory Visit | Attending: Obstetrics and Gynecology | Admitting: Obstetrics and Gynecology

## 2015-08-02 DIAGNOSIS — Z3A34 34 weeks gestation of pregnancy: Secondary | ICD-10-CM | POA: Insufficient documentation

## 2015-08-02 DIAGNOSIS — O1493 Unspecified pre-eclampsia, third trimester: Secondary | ICD-10-CM | POA: Insufficient documentation

## 2015-08-02 DIAGNOSIS — R51 Headache: Secondary | ICD-10-CM

## 2015-08-02 DIAGNOSIS — M549 Dorsalgia, unspecified: Secondary | ICD-10-CM | POA: Insufficient documentation

## 2015-08-02 DIAGNOSIS — O9989 Other specified diseases and conditions complicating pregnancy, childbirth and the puerperium: Secondary | ICD-10-CM

## 2015-08-02 DIAGNOSIS — O26893 Other specified pregnancy related conditions, third trimester: Secondary | ICD-10-CM

## 2015-08-02 DIAGNOSIS — R030 Elevated blood-pressure reading, without diagnosis of hypertension: Secondary | ICD-10-CM | POA: Diagnosis present

## 2015-08-02 LAB — COMPREHENSIVE METABOLIC PANEL
ALBUMIN: 1.5 g/dL — AB (ref 3.5–5.0)
ALK PHOS: 95 U/L (ref 38–126)
ALT: 14 U/L (ref 14–54)
AST: 13 U/L — AB (ref 15–41)
Anion gap: 7 (ref 5–15)
BILIRUBIN TOTAL: 0.6 mg/dL (ref 0.3–1.2)
BUN: 10 mg/dL (ref 6–20)
CALCIUM: 8 mg/dL — AB (ref 8.9–10.3)
CO2: 23 mmol/L (ref 22–32)
CREATININE: 0.43 mg/dL — AB (ref 0.44–1.00)
Chloride: 107 mmol/L (ref 101–111)
GFR calc Af Amer: >60 mL/min (ref >60)
GLUCOSE: 78 mg/dL (ref 65–99)
Potassium: 3.5 mmol/L (ref 3.5–5.1)
Sodium: 137 mmol/L (ref 135–145)
TOTAL PROTEIN: 5.5 g/dL — AB (ref 6.5–8.1)

## 2015-08-02 LAB — CBC
HEMATOCRIT: 36.6 % (ref 36.0–46.0)
HEMOGLOBIN: 13.1 g/dL (ref 12.0–15.0)
MCH: 32.2 pg (ref 26.0–34.0)
MCHC: 35.8 g/dL (ref 30.0–36.0)
MCV: 89.9 fL (ref 78.0–100.0)
Platelets: 243 10*3/uL (ref 150–400)
RBC: 4.07 MIL/uL (ref 3.87–5.11)
RDW: 13 % (ref 11.5–15.5)
WBC: 10.4 10*3/uL (ref 4.0–10.5)

## 2015-08-02 LAB — URINALYSIS, ROUTINE W REFLEX MICROSCOPIC
BILIRUBIN URINE: NEGATIVE
GLUCOSE, UA: NEGATIVE mg/dL
KETONES UR: 15 mg/dL — AB
NITRITE: NEGATIVE
PH: 6.5 (ref 5.0–8.0)
Protein, ur: >300 mg/dL — AB
SPECIFIC GRAVITY, URINE: 1.02 (ref 1.005–1.030)
Urobilinogen, UA: 0.2 mg/dL (ref 0.0–1.0)

## 2015-08-02 LAB — PROTEIN / CREATININE RATIO, URINE
Creatinine, Urine: 117 mg/dL
Protein Creatinine Ratio: 11.52 mg/mg{Cre} — ABNORMAL HIGH (ref 0.00–0.15)
TOTAL PROTEIN, URINE: 1348 mg/dL

## 2015-08-02 LAB — URINE MICROSCOPIC-ADD ON

## 2015-08-02 LAB — URIC ACID: URIC ACID, SERUM: 2.7 mg/dL (ref 2.3–6.6)

## 2015-08-02 MED ORDER — CYCLOBENZAPRINE HCL 10 MG PO TABS: 10.0000 mg | ORAL_TABLET | Freq: Three times a day (TID) | ORAL | Status: DC | PRN

## 2015-08-02 MED ORDER — CYCLOBENZAPRINE HCL 10 MG PO TABS
10.0000 mg | ORAL_TABLET | Freq: Once | ORAL | Status: AC
  Administered 2015-08-02: 10 mg via ORAL
  Filled 2015-08-02: qty 1

## 2015-08-02 MED ORDER — ACETAMINOPHEN 500 MG PO TABS
1000.0000 mg | ORAL_TABLET | ORAL | Status: AC
  Administered 2015-08-02: 1000 mg via ORAL
  Filled 2015-08-02: qty 2

## 2015-08-02 NOTE — Discharge Instructions (Signed)

## 2015-08-02 NOTE — MAU Provider Note (Signed)
History     CSN: [id number]  Arrival date and time: 08/02/15 1550   First Provider Initiated Contact with Patient 08/02/15 1655      Chief Complaint  Patient presents with  . Hypertension  . Headache   HPI Meghan Calhoun 24 y.o. G1P0000  presents to MAU for eval of high blood pressure, increased protein in urine and headache.  She was seen in clinic earlier today and told to come here.  Her BP has been 130s/90s today.  She has a headache, 6/10 that has been ongoing all day.  It is right sided.  Denies vision changes, nausea, vomiting, weakness, increased swelling in feet, epigastric pain.  She took Tylenol at 10:30am today for HA and it did not help.   OB History    Gravida Para Term Preterm AB TAB SAB Ectopic Multiple Living   1 0 0 0 0 0 0 0        Past Medical History  Diagnosis Date  . Medical history non-contributory     Past Surgical History  Procedure Laterality Date  . Elbow surgery    . Mouth surgery    . No past surgeries      Family History  Problem Relation Age of Onset  . Hypertension Mother     Social History  Substance Use Topics  . Smoking status: Never Smoker   . Smokeless tobacco: None  . Alcohol Use: No     Comment: occas    Allergies: No Known Allergies  Prescriptions prior to admission  Medication Sig Dispense Refill Last Dose  . acetaminophen (TYLENOL) 325 MG tablet Take 650 mg by mouth every 6 (six) hours as needed for headache.   08/02/2015 at Unknown time  . Prenatal Vit-Fe Fumarate-FA (PRENATAL MULTIVITAMIN) TABS tablet Take 1 tablet by mouth daily at 12 noon.   08/01/2015 at Unknown time    ROS Pertinent ROS in HPI.  All other systems are negative.   Physical Exam   Blood pressure 134/95, pulse 97, temperature 98.4 F (36.9 C), temperature source Oral, resp. rate 16.  Physical Exam  Constitutional: She is oriented to person, place, and time. She appears well-developed and well-nourished. No distress.  HENT:  Head:  Normocephalic and atraumatic.  Eyes: EOM are normal.  Neck: Normal range of motion.  Cardiovascular: Normal rate.   Respiratory: Effort normal. No respiratory distress.  GI: Soft. She exhibits no distension. There is no tenderness. There is no rebound and no guarding.  Musculoskeletal: Normal range of motion.  Neurological: She is alert and oriented to person, place, and time.  Skin: Skin is warm and dry.  Psychiatric: She has a normal mood and affect.   Results for orders placed or performed during the hospital encounter of 08/02/15 (from the past 24 hour(s))  Urinalysis, Routine w reflex microscopic (not at [facility])     Status: Abnormal   Collection Time: 08/02/15  4:00 PM  Result Value Ref Range   Color, Urine YELLOW YELLOW   APPearance HAZY (A) CLEAR   Specific Gravity, Urine 1.020 1.005 - 1.030   pH 6.5 5.0 - 8.0   Glucose, UA NEGATIVE NEGATIVE mg/dL   Hgb urine dipstick SMALL (A) NEGATIVE   Bilirubin Urine NEGATIVE NEGATIVE   Ketones, ur 15 (A) NEGATIVE mg/dL   Protein, ur >401 (A) NEGATIVE mg/dL   Urobilinogen, UA 0.2 0.0 - 1.0 mg/dL   Nitrite NEGATIVE NEGATIVE   Leukocytes, UA [id number]  Protein / creatinine ratio, urine  Status: Abnormal   Collection Time: 08/02/15  4:00 PM  Result Value Ref Range   Creatinine, Urine 117.00 mg/dL   Total Protein, Urine 1348 mg/dL   Protein Creatinine Ratio 11.52 (H) 0.00 - 0.15 mg/mg[Cre]  Urine microscopic-add on     Status: Abnormal   Collection Time: 08/02/15  4:00 PM  Result Value Ref Range   Squamous Epithelial / LPF MANY (A) RARE   WBC, UA 11-20 <3 WBC/hpf   RBC / HPF 3-6 <3 RBC/hpf   Bacteria, UA MANY (A) RARE  CBC     Status: None   Collection Time: 08/02/15  4:25 PM  Result Value Ref Range   WBC 10.4 4.0 - 10.5 K/uL   RBC 4.07 3.87 - 5.11 MIL/uL   Hemoglobin 13.1 12.0 - 15.0 g/dL   HCT 16.1 09.6 - 04.5 %   MCV 89.9 78.0 - 100.0 fL   MCH 32.2 26.0 - 34.0 pg   MCHC 35.8 30.0 - 36.0 g/dL   RDW 40.9 81.1 - 91.4  %   Platelets 243 150 - 400 K/uL  Comprehensive metabolic panel     Status: Abnormal   Collection Time: 08/02/15  4:25 PM  Result Value Ref Range   Sodium 137 135 - 145 mmol/L   Potassium 3.5 3.5 - 5.1 mmol/L   Chloride 107 101 - 111 mmol/L   CO2 23 22 - 32 mmol/L   Glucose, Bld 78 65 - 99 mg/dL   BUN 10 6 - 20 mg/dL   Creatinine, Ser 7.82 (L) 0.44 - 1.00 mg/dL   Calcium 8.0 (L) 8.9 - 10.3 mg/dL   Total Protein 5.5 (L) 6.5 - 8.1 g/dL   Albumin 1.5 (L) 3.5 - 5.0 g/dL   AST 13 (L) 15 - 41 U/L   ALT 14 14 - 54 U/L   Alkaline Phosphatase 95 38 - 126 U/L   Total Bilirubin 0.6 0.3 - 1.2 mg/dL   GFR calc non Af Amer >60 >60 mL/min   GFR calc Af Amer >60 >60 mL/min   Anion gap 7 5 - 15  Uric acid     Status: None   Collection Time: 08/02/15  4:25 PM  Result Value Ref Range   Uric Acid, Serum 2.7 2.3 - 6.6 mg/dL   Fetal Tracing: [id number] Variability:mod Accelerations: 15x15s Decelerations: occas variable Toco:irreg    MAU Course  Procedures  MDM Discussed with Dr. Henderson Cloud.  Labs, blood pressures and symptoms reviewed.  Need for delivery to come from symptoms and fetal monitoring - not from labs.   Per Dr. Henderson Cloud, will add Tylenol to previously ordered Flexeril for HA.  Monitor x 45 minutes and call back with progress.   Pt's HA improved from 6/10 to 3/10.   Discussed with MD.  Molli Knock to discharge pt to home with precautions.  Rx for flexeril.   Assessment and Plan  A:  1. Headache in pregnancy, third trimester   2. Preeclampsia, third trimester   3. Back pain affecting pregnancy in third trimester    P: Discharge to home Preeclampsia precautions given  Pt to f/u in clinic on Monday, 10/10.   Flexeril rx prn headache/back pain Patient may return to MAU as needed or if her condition were to change or worsen   Bertram Denver 08/02/2015, 4:56 PM

## 2015-08-02 NOTE — MAU Note (Signed)
Urine in lab 

## 2015-08-02 NOTE — MAU Note (Signed)
Sent from office for further eval.  BP elevated and has HA.  Denies swelling, visual changes or epigastric pain

## 2015-08-06 LAB — OB RESULTS CONSOLE GBS: GBS: NEGATIVE

## 2015-08-16 ENCOUNTER — Telehealth (HOSPITAL_COMMUNITY): Payer: Self-pay | Admitting: *Deleted

## 2015-08-16 ENCOUNTER — Encounter (HOSPITAL_COMMUNITY): Payer: Self-pay | Admitting: *Deleted

## 2015-08-16 NOTE — Telephone Encounter (Signed)
Preadmission screen  

## 2015-08-20 ENCOUNTER — Inpatient Hospital Stay (HOSPITAL_COMMUNITY): Payer: PRIVATE HEALTH INSURANCE | Admitting: Anesthesiology

## 2015-08-20 ENCOUNTER — Inpatient Hospital Stay (HOSPITAL_COMMUNITY)
Admission: RE | Admit: 2015-08-20 | Discharge: 2015-08-22 | DRG: 775 | Disposition: A | Discharge status: Home / Self Care | Payer: PRIVATE HEALTH INSURANCE | Source: Ambulatory Visit | Attending: Obstetrics & Gynecology | Admitting: Obstetrics & Gynecology

## 2015-08-20 ENCOUNTER — Encounter (HOSPITAL_COMMUNITY): Payer: Self-pay

## 2015-08-20 DIAGNOSIS — Z8249 Family history of ischemic heart disease and other diseases of the circulatory system: Secondary | ICD-10-CM | POA: Diagnosis not present

## 2015-08-20 DIAGNOSIS — Z3490 Encounter for supervision of normal pregnancy, unspecified, unspecified trimester: Secondary | ICD-10-CM

## 2015-08-20 DIAGNOSIS — O1494 Unspecified pre-eclampsia, complicating childbirth: Secondary | ICD-10-CM | POA: Diagnosis present

## 2015-08-20 DIAGNOSIS — Z3A37 37 weeks gestation of pregnancy: Secondary | ICD-10-CM

## 2015-08-20 DIAGNOSIS — O1490 Unspecified pre-eclampsia, unspecified trimester: Secondary | ICD-10-CM | POA: Diagnosis present

## 2015-08-20 LAB — COMPREHENSIVE METABOLIC PANEL
ALT: 13 U/L — AB (ref 14–54)
ANION GAP: 5 (ref 5–15)
AST: 15 U/L (ref 15–41)
Albumin: 1.2 g/dL — ABNORMAL LOW (ref 3.5–5.0)
Alkaline Phosphatase: 93 U/L (ref 38–126)
BUN: 6 mg/dL (ref 6–20)
CHLORIDE: 110 mmol/L (ref 101–111)
CO2: 22 mmol/L (ref 22–32)
Calcium: 7.1 mg/dL — ABNORMAL LOW (ref 8.9–10.3)
Creatinine, Ser: 0.43 mg/dL — ABNORMAL LOW (ref 0.44–1.00)
GFR calc non Af Amer: >60 mL/min (ref >60)
Glucose, Bld: 88 mg/dL (ref 65–99)
Potassium: 3.1 mmol/L — ABNORMAL LOW (ref 3.5–5.1)
SODIUM: 137 mmol/L (ref 135–145)
Total Bilirubin: 0.4 mg/dL (ref 0.3–1.2)
Total Protein: 4.5 g/dL — ABNORMAL LOW (ref 6.5–8.1)

## 2015-08-20 LAB — CBC
HCT: 33.4 % — ABNORMAL LOW (ref 36.0–46.0)
HEMATOCRIT: 34.2 % — AB (ref 36.0–46.0)
HEMOGLOBIN: 12.2 g/dL (ref 12.0–15.0)
HEMOGLOBIN: 12.4 g/dL (ref 12.0–15.0)
MCH: 32.6 pg (ref 26.0–34.0)
MCH: 32.4 pg (ref 26.0–34.0)
MCHC: 36.5 g/dL — AB (ref 30.0–36.0)
MCHC: 36.3 g/dL — ABNORMAL HIGH (ref 30.0–36.0)
MCV: 89.3 fL (ref 78.0–100.0)
MCV: 89.3 fL (ref 78.0–100.0)
Platelets: 230 10*3/uL (ref 150–400)
Platelets: 240 10*3/uL (ref 150–400)
RBC: 3.74 MIL/uL — ABNORMAL LOW (ref 3.87–5.11)
RBC: 3.83 MIL/uL — AB (ref 3.87–5.11)
RDW: 13.5 % (ref 11.5–15.5)
RDW: 13.6 % (ref 11.5–15.5)
WBC: 10.3 10*3/uL (ref 4.0–10.5)
WBC: 9.3 10*3/uL (ref 4.0–10.5)

## 2015-08-20 LAB — TYPE AND SCREEN
ABO/RH(D): B POS
Antibody Screen: NEGATIVE

## 2015-08-20 LAB — ABO/RH: ABO/RH(D): B POS

## 2015-08-20 LAB — RPR: RPR: NONREACTIVE

## 2015-08-20 MED ORDER — FENTANYL 2.5 MCG/ML BUPIVACAINE 1/10 % EPIDURAL INFUSION (WH - ANES)
14.0000 mL/h | INTRAMUSCULAR | Status: DC | PRN
  Administered 2015-08-20 (×3): 14 mL/h via EPIDURAL
  Filled 2015-08-20 (×2): qty 125

## 2015-08-20 MED ORDER — LACTATED RINGERS IV SOLN
INTRAVENOUS | Status: DC
  Administered 2015-08-20: 02:00:00 via INTRAVENOUS
  Administered 2015-08-20 (×2): 100 mL/h via INTRAVENOUS

## 2015-08-20 MED ORDER — PHENYLEPHRINE 40 MCG/ML (10ML) SYRINGE FOR IV PUSH (FOR BLOOD PRESSURE SUPPORT)
80.0000 ug | PREFILLED_SYRINGE | INTRAVENOUS | Status: DC | PRN
  Filled 2015-08-20: qty 2
  Filled 2015-08-20: qty 20

## 2015-08-20 MED ORDER — CITRIC ACID-SODIUM CITRATE 334-500 MG/5ML PO SOLN: 30.0000 mL | ORAL | Status: DC | PRN

## 2015-08-20 MED ORDER — ZOLPIDEM TARTRATE 5 MG PO TABS: 5.0000 mg | ORAL_TABLET | Freq: Every evening | ORAL | Status: DC | PRN

## 2015-08-20 MED ORDER — OXYTOCIN BOLUS FROM INFUSION: 500.0000 mL | INTRAVENOUS | Status: DC

## 2015-08-20 MED ORDER — ACETAMINOPHEN 325 MG PO TABS: 650.0000 mg | ORAL_TABLET | ORAL | Status: DC | PRN

## 2015-08-20 MED ORDER — OXYTOCIN 40 UNITS IN LACTATED RINGERS INFUSION - SIMPLE MED
62.5000 mL/h | INTRAVENOUS | Status: DC
  Filled 2015-08-20: qty 1000

## 2015-08-20 MED ORDER — DIPHENHYDRAMINE HCL 50 MG/ML IJ SOLN: 12.5000 mg | INTRAMUSCULAR | Status: DC | PRN

## 2015-08-20 MED ORDER — LIDOCAINE HCL (PF) 1 % IJ SOLN
INTRAMUSCULAR | Status: DC | PRN
  Administered 2015-08-20 (×2): 4 mL

## 2015-08-20 MED ORDER — OXYCODONE-ACETAMINOPHEN 5-325 MG PO TABS: 2.0000 | ORAL_TABLET | ORAL | Status: DC | PRN

## 2015-08-20 MED ORDER — OXYCODONE-ACETAMINOPHEN 5-325 MG PO TABS: 1.0000 | ORAL_TABLET | ORAL | Status: DC | PRN

## 2015-08-20 MED ORDER — LIDOCAINE HCL (PF) 1 % IJ SOLN
30.0000 mL | INTRAMUSCULAR | Status: DC | PRN
  Filled 2015-08-20: qty 30

## 2015-08-20 MED ORDER — TERBUTALINE SULFATE 1 MG/ML IJ SOLN
0.2500 mg | Freq: Once | INTRAMUSCULAR | Status: DC | PRN
  Filled 2015-08-20: qty 1

## 2015-08-20 MED ORDER — OXYTOCIN 40 UNITS IN LACTATED RINGERS INFUSION - SIMPLE MED
2.0000 m[IU]/min | INTRAVENOUS | Status: DC
  Administered 2015-08-20: 6 m[IU]/min via INTRAVENOUS
  Administered 2015-08-20: 8 m[IU]/min via INTRAVENOUS
  Administered 2015-08-20: 2 m[IU]/min via INTRAVENOUS
  Administered 2015-08-20: 10 m[IU]/min via INTRAVENOUS
  Administered 2015-08-20: 12 m[IU]/min via INTRAVENOUS
  Administered 2015-08-20: 16 m[IU]/min via INTRAVENOUS

## 2015-08-20 MED ORDER — LACTATED RINGERS IV SOLN
500.0000 mL | INTRAVENOUS | Status: DC | PRN
  Administered 2015-08-20: 1000 mL via INTRAVENOUS

## 2015-08-20 MED ORDER — MISOPROSTOL 25 MCG QUARTER TABLET
25.0000 ug | ORAL_TABLET | ORAL | Status: DC | PRN
  Administered 2015-08-20 (×2): 25 ug via VAGINAL
  Filled 2015-08-20 (×2): qty 0.25
  Filled 2015-08-20: qty 1

## 2015-08-20 MED ORDER — ONDANSETRON HCL 4 MG/2ML IJ SOLN
4.0000 mg | Freq: Four times a day (QID) | INTRAMUSCULAR | Status: DC | PRN
  Filled 2015-08-20: qty 2

## 2015-08-20 MED ORDER — BUTORPHANOL TARTRATE 1 MG/ML IJ SOLN
1.0000 mg | INTRAMUSCULAR | Status: DC | PRN
  Administered 2015-08-20 (×2): 1 mg via INTRAVENOUS
  Filled 2015-08-20 (×2): qty 1

## 2015-08-20 MED ORDER — EPHEDRINE 5 MG/ML INJ
10.0000 mg | INTRAVENOUS | Status: DC | PRN
Start: 2015-08-20 — End: 2015-08-21
  Filled 2015-08-20: qty 2

## 2015-08-20 MED ORDER — FLEET ENEMA 7-19 GM/118ML RE ENEM: 1.0000 | ENEMA | RECTAL | Status: DC | PRN

## 2015-08-20 NOTE — H&P (Addendum)
Meghan Calhoun is a 24 y.o. female presenting for IOL secondary to pre-eclampsia per MFM.  She has never had symptoms of pre-eclampsia and labs wnl.  Antepartum course otherwise uncomplicated.  GBS negative.  Currently denies HA, CP/SOB, RUQ pain, and visual disturbance.  Patient received 2 doses of VMP last night with last dose at 610 this AM.  She reports 6/10 pain in her low back.    Maternal Medical History:  Contractions: Onset was 6-12 hours ago.   Frequency: irregular.   Perceived severity is mild.    Fetal activity: Perceived fetal activity is normal.   Last perceived fetal movement was within the past hour.    Prenatal complications: PIH.   Prenatal Complications - Diabetes: none.    OB History    Gravida Para Term Preterm AB TAB SAB Ectopic Multiple Living   1 0 0 0 0 0 0 0       Past Medical History  Diagnosis Date  . Medical history non-contributory   . Hx of varicella    Past Surgical History  Procedure Laterality Date  . Elbow surgery    . Mouth surgery     Family History: family history includes Anxiety disorder in her sister; Hypertension in her mother. Social History:  reports that she has never smoked. She has never used smokeless tobacco. She reports that she does not drink alcohol or use illicit drugs.   Prenatal Transfer Tool  Maternal Diabetes: No Genetic Screening: Normal Maternal Ultrasounds/Referrals: Normal Fetal Ultrasounds or other Referrals:  Referred to Materal Fetal Medicine  Maternal Substance Abuse:  No Significant Maternal Medications:  None Significant Maternal Lab Results:  Lab values include: Group B Strep negative Other Comments:  None  ROS  Dilation: 2 Effacement (%): 50 Station: -3 Exam by:: L.Stubbs, RN Blood pressure 144/90, pulse 81, temperature 98 F (36.7 C), temperature source Oral, resp. rate 16, height 5\' 5"  (1.651 m), weight 86.183 kg (190 lb). Maternal Exam:  Uterine Assessment: Contraction strength is mild.   Contraction frequency is irregular.   Abdomen: Patient reports no abdominal tenderness. Fundal height is c/w dates.   Estimated fetal weight is 6#8.       Physical Exam  Constitutional: She is oriented to person, place, and time. She appears well-developed and well-nourished.  GI: Soft. There is no rebound and no guarding.  Neurological: She is alert and oriented to person, place, and time.  Skin: Skin is warm and dry.  Psychiatric: She has a normal mood and affect. Her behavior is normal.    Prenatal labs: ABO, Rh: B/Positive/-- (04/11 0000) Antibody: Negative (04/11 0000) Rubella: Immune (04/11 0000) RPR: Nonreactive (04/11 0000)  HBsAg: Negative (04/11 0000)  HIV: Non-reactive (04/11 0000)  GBS:     Assessment/Plan: 24yo G1 at 3872w0d for IOL secondary to pre-eclampsia -Will check cvx and place foley bulb if needed -Epidural when ready -Treat BP prn with labetalol   Meghan Calhoun 08/20/2015, 8:04 AM

## 2015-08-20 NOTE — Anesthesia Procedure Notes (Signed)
Epidural Patient location during procedure: OB  Staffing Anesthesiologist: Amandy Chubbuck Performed by: anesthesiologist   Preanesthetic Checklist Completed: patient identified, site marked, surgical consent, pre-op evaluation, timeout performed, IV checked, risks and benefits discussed and monitors and equipment checked  Epidural Patient position: sitting Prep: site prepped and draped and DuraPrep Patient monitoring: continuous pulse ox and blood pressure Approach: midline Location: L3-L4 Injection technique: LOR saline  Needle:  Needle type: Tuohy  Needle gauge: 17 G Needle length: 9 cm and 9 Needle insertion depth: 5.5 cm Catheter type: closed end flexible Catheter size: 19 Gauge Catheter at skin depth: 10 cm Test dose: negative  Assessment Events: blood not aspirated, injection not painful, no injection resistance, negative IV test and no paresthesia  Additional Notes Patient identified. Risks/Benefits/Options discussed with patient including but not limited to bleeding, infection, nerve damage, paralysis, failed block, incomplete pain control, headache, blood pressure changes, nausea, vomiting, reactions to medication both or allergic, itching and postpartum back pain. Confirmed with bedside nurse the patient's most recent platelet count. Confirmed with patient that they are not currently taking any anticoagulation, have any bleeding history or any family history of bleeding disorders. Patient expressed understanding and wished to proceed. All questions were answered. Sterile technique was used throughout the entire procedure. Please see nursing notes for vital signs. Test dose was given through epidural catheter and negative prior to continuing to dose epidural or start infusion. Warning signs of high block given to the patient including shortness of breath, tingling/numbness in hands, complete motor block, or any concerning symptoms with instructions to call for help. Patient was  given instructions on fall risk and not to get out of bed. All questions and concerns addressed with instructions to call with any issues or inadequate analgesia.

## 2015-08-20 NOTE — Anesthesia Preprocedure Evaluation (Signed)
Anesthesia Evaluation  Patient identified by MRN, date of birth, ID band Patient awake    Reviewed: Allergy & Precautions, NPO status , Patient's Chart, lab work & pertinent test results  History of Anesthesia Complications Negative for: history of anesthetic complications  Airway Mallampati: II  TM Distance: >3 FB Neck ROM: Full    Dental no notable dental hx. (+) Dental Advisory Given   Pulmonary neg pulmonary ROS,    Pulmonary exam normal breath sounds clear to auscultation       Cardiovascular hypertension (PreE), Normal cardiovascular exam Rhythm:Regular Rate:Normal     Neuro/Psych negative neurological ROS  negative psych ROS   GI/Hepatic negative GI ROS, Neg liver ROS,   Endo/Other  obesity  Renal/GU negative Renal ROS  negative genitourinary   Musculoskeletal negative musculoskeletal ROS (+)   Abdominal   Peds negative pediatric ROS (+)  Hematology negative hematology ROS (+)   Anesthesia Other Findings   Reproductive/Obstetrics (+) Pregnancy                             Anesthesia Physical Anesthesia Plan  ASA: III  Anesthesia Plan: Epidural   Post-op Pain Management:    Induction:   Airway Management Planned:   Additional Equipment:   Intra-op Plan:   Post-operative Plan:   Informed Consent: I have reviewed the patients History and Physical, chart, labs and discussed the procedure including the risks, benefits and alternatives for the proposed anesthesia with the patient or authorized representative who has indicated his/her understanding and acceptance.   Dental advisory given  Plan Discussed with: CRNA  Anesthesia Plan Comments:         Anesthesia Quick Evaluation

## 2015-08-20 NOTE — Progress Notes (Signed)
Dr. Langston MaskerMorris notified of potassium levels. No new orders.

## 2015-08-21 DIAGNOSIS — Z3490 Encounter for supervision of normal pregnancy, unspecified, unspecified trimester: Secondary | ICD-10-CM

## 2015-08-21 LAB — COMPREHENSIVE METABOLIC PANEL
ALT: 14 U/L (ref 14–54)
ANION GAP: 3 — AB (ref 5–15)
AST: 20 U/L (ref 15–41)
Albumin: 1.3 g/dL — ABNORMAL LOW (ref 3.5–5.0)
Alkaline Phosphatase: 80 U/L (ref 38–126)
BUN: 8 mg/dL (ref 6–20)
CHLORIDE: 108 mmol/L (ref 101–111)
CO2: 26 mmol/L (ref 22–32)
CREATININE: 0.51 mg/dL (ref 0.44–1.00)
Calcium: 7.5 mg/dL — ABNORMAL LOW (ref 8.9–10.3)
GFR calc non Af Amer: >60 mL/min (ref >60)
Glucose, Bld: 89 mg/dL (ref 65–99)
POTASSIUM: 3.2 mmol/L — AB (ref 3.5–5.1)
SODIUM: 137 mmol/L (ref 135–145)
Total Bilirubin: 0.4 mg/dL (ref 0.3–1.2)
Total Protein: 4.6 g/dL — ABNORMAL LOW (ref 6.5–8.1)

## 2015-08-21 LAB — CBC
HCT: 29 % — ABNORMAL LOW (ref 36.0–46.0)
HCT: 29.1 % — ABNORMAL LOW (ref 36.0–46.0)
Hemoglobin: 10.5 g/dL — ABNORMAL LOW (ref 12.0–15.0)
Hemoglobin: 10.5 g/dL — ABNORMAL LOW (ref 12.0–15.0)
MCH: 32.6 pg (ref 26.0–34.0)
MCH: 32.5 pg (ref 26.0–34.0)
MCHC: 36.2 g/dL — AB (ref 30.0–36.0)
MCHC: 36.1 g/dL — ABNORMAL HIGH (ref 30.0–36.0)
MCV: 90.1 fL (ref 78.0–100.0)
MCV: 90.1 fL (ref 78.0–100.0)
PLATELETS: 221 10*3/uL (ref 150–400)
PLATELETS: 210 10*3/uL (ref 150–400)
RBC: 3.22 MIL/uL — ABNORMAL LOW (ref 3.87–5.11)
RBC: 3.23 MIL/uL — AB (ref 3.87–5.11)
RDW: 13.8 % (ref 11.5–15.5)
RDW: 13.7 % (ref 11.5–15.5)
WBC: 16 10*3/uL — ABNORMAL HIGH (ref 4.0–10.5)
WBC: 12.7 10*3/uL — AB (ref 4.0–10.5)

## 2015-08-21 LAB — URIC ACID: URIC ACID, SERUM: 2.4 mg/dL (ref 2.3–6.6)

## 2015-08-21 MED ORDER — ONDANSETRON HCL 4 MG PO TABS: 4.0000 mg | ORAL_TABLET | ORAL | Status: DC | PRN

## 2015-08-21 MED ORDER — SIMETHICONE 80 MG PO CHEW: 80.0000 mg | CHEWABLE_TABLET | ORAL | Status: DC | PRN

## 2015-08-21 MED ORDER — ONDANSETRON HCL 4 MG/2ML IJ SOLN
4.0000 mg | INTRAMUSCULAR | Status: DC | PRN
Start: 2015-08-21 — End: 2015-08-22
  Administered 2015-08-21: 4 mg via INTRAVENOUS
  Filled 2015-08-21: qty 2

## 2015-08-21 MED ORDER — TETANUS-DIPHTH-ACELL PERTUSSIS 5-2.5-18.5 LF-MCG/0.5 IM SUSP: 0.5000 mL | Freq: Once | INTRAMUSCULAR | Status: DC

## 2015-08-21 MED ORDER — LANOLIN HYDROUS EX OINT: TOPICAL_OINTMENT | CUTANEOUS | Status: DC | PRN

## 2015-08-21 MED ORDER — ACETAMINOPHEN 325 MG PO TABS
650.0000 mg | ORAL_TABLET | ORAL | Status: DC | PRN
  Administered 2015-08-21: 650 mg via ORAL
  Filled 2015-08-21: qty 2

## 2015-08-21 MED ORDER — IBUPROFEN 600 MG PO TABS
600.0000 mg | ORAL_TABLET | Freq: Four times a day (QID) | ORAL | Status: DC
  Administered 2015-08-21 – 2015-08-22 (×6): 600 mg via ORAL
  Filled 2015-08-21 (×6): qty 1

## 2015-08-21 MED ORDER — CYCLOBENZAPRINE HCL 10 MG PO TABS
10.0000 mg | ORAL_TABLET | Freq: Three times a day (TID) | ORAL | Status: DC | PRN
  Administered 2015-08-21: 10 mg via ORAL
  Filled 2015-08-21 (×2): qty 1

## 2015-08-21 MED ORDER — INFLUENZA VAC SPLIT QUAD 0.5 ML IM SUSY: 0.5000 mL | PREFILLED_SYRINGE | INTRAMUSCULAR | Status: DC

## 2015-08-21 MED ORDER — DIBUCAINE 1 % RE OINT: 1.0000 "application " | TOPICAL_OINTMENT | RECTAL | Status: DC | PRN

## 2015-08-21 MED ORDER — DIPHENHYDRAMINE HCL 25 MG PO CAPS: 25.0000 mg | ORAL_CAPSULE | Freq: Four times a day (QID) | ORAL | Status: DC | PRN

## 2015-08-21 MED ORDER — SENNOSIDES-DOCUSATE SODIUM 8.6-50 MG PO TABS
2.0000 | ORAL_TABLET | ORAL | Status: DC
Start: 2015-08-21 — End: 2015-08-22
  Administered 2015-08-21: 2 via ORAL
  Filled 2015-08-21: qty 2

## 2015-08-21 MED ORDER — OXYCODONE-ACETAMINOPHEN 5-325 MG PO TABS
1.0000 | ORAL_TABLET | ORAL | Status: DC | PRN
  Administered 2015-08-21 (×2): 1 via ORAL
  Filled 2015-08-21: qty 1

## 2015-08-21 MED ORDER — PRENATAL MULTIVITAMIN CH
1.0000 | ORAL_TABLET | Freq: Every day | ORAL | Status: DC
  Administered 2015-08-21 – 2015-08-22 (×2): 1 via ORAL
  Filled 2015-08-21 (×2): qty 1

## 2015-08-21 MED ORDER — BENZOCAINE-MENTHOL 20-0.5 % EX AERO
1.0000 "application " | INHALATION_SPRAY | CUTANEOUS | Status: DC | PRN
  Administered 2015-08-21: 1 via TOPICAL
  Filled 2015-08-21: qty 112

## 2015-08-21 MED ORDER — WITCH HAZEL-GLYCERIN EX PADS: 1.0000 "application " | MEDICATED_PAD | CUTANEOUS | Status: DC | PRN

## 2015-08-21 MED ORDER — ZOLPIDEM TARTRATE 5 MG PO TABS: 5.0000 mg | ORAL_TABLET | Freq: Every evening | ORAL | Status: DC | PRN

## 2015-08-21 MED ORDER — OXYCODONE-ACETAMINOPHEN 5-325 MG PO TABS
2.0000 | ORAL_TABLET | ORAL | Status: DC | PRN
  Administered 2015-08-22: 2 via ORAL
  Filled 2015-08-21: qty 2

## 2015-08-21 NOTE — Progress Notes (Signed)
Post Partum Day 1 Subjective: no complaints, up ad lib, voiding, tolerating PO and denies HA, blurred vision or RUQ pain  Objective: Blood pressure 131/86, pulse 88, temperature 98.5 F (36.9 C), temperature source Oral, resp. rate 18, height 5\' 5"  (1.651 m), weight 190 lb (86.183 kg), SpO2 99 %, unknown if currently breastfeeding.  Physical Exam:  General: alert and cooperative Lochia: appropriate Uterine Fundus: firm Incision: healing well DVT Evaluation: No evidence of DVT seen on physical exam. Negative Homan's sign. No cords or calf tenderness. Calf/Ankle edema is present. DTR's 2+ no clonus   Recent Labs  08/20/15 1043 08/21/15 0545  HGB 12.4 10.5*  HCT 34.2* 29.0*    Assessment/Plan: Plan for discharge tomorrow and Circumcision prior to discharge   LOS: 1 day   Navi Erber G 08/21/2015, 8:06 AM

## 2015-08-21 NOTE — Anesthesia Postprocedure Evaluation (Signed)
  Anesthesia Post-op Note  Patient: Meghan Calhoun  Procedure(s) Performed: * No procedures listed *  Patient Location: PACU and Mother/Baby  Anesthesia Type:Epidural  Level of Consciousness: awake, alert  and oriented  Airway and Oxygen Therapy: Patient Spontanous Breathing  Post-op Pain: mild  Post-op Assessment: Patient's Cardiovascular Status Stable, Respiratory Function Stable, No signs of Nausea or vomiting, Adequate PO intake, Pain level controlled, No headache, No backache and Patient able to bend at knees              Post-op Vital Signs: Reviewed and stable  Last Vitals:  Filed Vitals:   08/21/15 0500  BP: 131/86  Pulse: 88  Temp: 36.9 C  Resp: 18    Complications: No apparent anesthesia complications

## 2015-08-21 NOTE — Lactation Note (Signed)
This note was copied from the chart of Meghan Howie IllKristen Conkey. Lactation Consultation Note  Patient Name: Meghan Calhoun ZOXWR'UToday's Date: 08/21/2015 Reason for consult: Initial assessment   Initial Consultation with first time mom and 17 hour old infant, Meghan Calhoun. Infant born at 37 weeks and weighed 5 pounds 15.9 ounces. Infant with 2 BF for 10 minutes, 4 attempts. 4 voids and 2 stools since birth. Infant has been gaggy and has spit some mucous earlier. Infant awake and rooting on hand. Placed to right breast in cross cradle hold. Infant licked at breast and then fell asleep. Mom is concerned infant has not fed well. Discussed NL NB feeding behavior, stomach size, nutritional needs, BF basics. Assisted mom in hand expressing, mom returned demonstration and expressed 4 cc colostrum that was fed to infant in a spoon. Infant tolerated it well. Discussed with parents that Meghan Calhoun is considered and early term infant and that he may show signs of a late preterm infant as he is right on the borderline. Encouraged parents to limit stimulation unless feeding. Enc mom to feed 8-12 x in 24 hours at first feeding cues and to hand express q 3 hours if he doesn't feed and feed to him in spoon or to call for assistance. Also talked with mom that if Meghan Calhoun not feeding well after he is 24 hours that she can begin pumping to stimulate breast to initiate supply. [facility] given and discussed OP services, BF Resources, and Support Groups. Enc mom to call with questions prn. Discussed plan with mom's RN.   Maternal Data Formula Feeding for Exclusion: No Has patient been taught Hand Expression?: Yes Does the patient have breastfeeding experience prior to this delivery?: No  Feeding Feeding Type: Breast Fed Length of feed: 0 min  LATCH Score/Interventions Latch: Too sleepy or reluctant, no latch achieved, no sucking elicited. Intervention(s): Skin to skin;Teach feeding cues;Waking techniques  Audible Swallowing:  None Intervention(s): Skin to skin;Hand expression  Type of Nipple: Everted at rest and after stimulation  Comfort (Breast/Nipple): Soft / non-tender     Hold (Positioning): Assistance needed to correctly position infant at breast and maintain latch. Intervention(s): Breastfeeding basics reviewed;Support Pillows;Position options;Skin to skin  LATCH Score: 5  Lactation Tools Discussed/Used     Consult Status Consult Status: Follow-up Date: 08/22/15 Follow-up type: In-patient    Silas FloodSharon S Mechell Girgis 08/21/2015, 2:58 PM

## 2015-08-22 LAB — COMPREHENSIVE METABOLIC PANEL
ALT: 10 U/L — ABNORMAL LOW (ref 14–54)
AST: 15 U/L (ref 15–41)
Albumin: 1.1 g/dL — ABNORMAL LOW (ref 3.5–5.0)
Alkaline Phosphatase: 76 U/L (ref 38–126)
Anion gap: 1 — ABNORMAL LOW (ref 5–15)
BUN: 5 mg/dL — ABNORMAL LOW (ref 6–20)
CHLORIDE: 110 mmol/L (ref 101–111)
CO2: 24 mmol/L (ref 22–32)
CREATININE: 0.5 mg/dL (ref 0.44–1.00)
Calcium: 7 mg/dL — ABNORMAL LOW (ref 8.9–10.3)
Glucose, Bld: 110 mg/dL — ABNORMAL HIGH (ref 65–99)
POTASSIUM: 3.3 mmol/L — AB (ref 3.5–5.1)
Sodium: 135 mmol/L (ref 135–145)
Total Bilirubin: 0.4 mg/dL (ref 0.3–1.2)
Total Protein: 4.3 g/dL — ABNORMAL LOW (ref 6.5–8.1)

## 2015-08-22 MED ORDER — OXYCODONE-ACETAMINOPHEN 5-325 MG PO TABS: 1.0000 | ORAL_TABLET | ORAL | Status: DC | PRN

## 2015-08-22 MED ORDER — IBUPROFEN 600 MG PO TABS: 600.0000 mg | ORAL_TABLET | Freq: Four times a day (QID) | ORAL | Status: DC

## 2015-08-22 NOTE — Progress Notes (Addendum)
Dr  Henderson Cloudomblin was called regarding patient's blood pressure of 158/94. Patient had no other signs of PIH. Orders were given to take blood pressures every 30 minutes and to order a CMET, CBC, and uric acid.   Later Dr . Henderson Cloudomblin was called back with results of labs and with results of blood pressure monitoring which consisted of 134/75 and 135/81. Per Dr. Henderson Cloudomblin discontinue blood pressure monitoring and no further orders were given.

## 2015-08-22 NOTE — Lactation Note (Addendum)
This note was copied from the chart of Meghan Calhoun. Lactation Consultation Note  Patient Name: Meghan Howie IllKristen Leverett ZOXWR'UToday's Date: 08/22/2015 Reason for consult: Follow-up assessment;Infant < 6lbs;Infant weight loss;Difficult latch   Follow up with 35 hour old infant. Infant born at 6037 weeks gestation. He has had 2 BF for up to 20 minutes, and 4 BF attempts, 3 voids and 3 stools in last 24 hours. Infant with 8% weight loss since birth. His latch scores have been 5 x 3 in last 24 hours. Infant has been supplemented 5 x with BM of 1-4 cc and 3 x with formula of 7 cc via syringe. Mom asked appropriate supplementation amounts as infant just ate 21 cc formula. Shared Supplementation amounts per feeding guidelines handout in her room. Discussed stomach size and nutritional requirements of NB. Discussed with mom that baby is acting like a typical 37 week infant in the NB phase. Advised her to attempt to breastfeed infant 8-12 x in 24 hours at breast and to awaken at 3 hours to feed infant. Follow BF with supplementation of pumped BM/formula post BF. Infant is having his circumcision currently, discuss that sometimes infants are sleepy after circumcision for a few hours.  Mom voiced that her plan is to breast and bottle feed, she would like infant to have BM, discussed that we can start baby on bottles prior to D/C. We discussed need for pump to take home as infant is not feeding well, mom reports she cannot order her pump for insurance company as infant was not due yet, she says she can send a birth certificate in to verify his birth and plans to do so. She says she is planning to take a manual pump home, discussed that a manual pump is generally not adequate stimulation from initiation and maintenance of lactation when infant not feeding. Left mom my number to call after she decides about pump rental. Told mom to call prn.   Maternal Data Formula Feeding for Exclusion: No Has patient been taught Hand  Expression?: Yes Does the patient have breastfeeding experience prior to this delivery?: No  Feeding Feeding Type: Formula  LATCH Score/Interventions                      Lactation Tools Discussed/Used     Consult Status Consult Status: Follow-up Date: 08/22/15 Follow-up type: In-patient    Silas FloodSharon S Lanisha Stepanian 08/22/2015, 8:22 AM

## 2015-08-22 NOTE — Discharge Summary (Signed)
Obstetric Discharge Summary Reason for Admission: induction of labor Prenatal Procedures: ultrasound Intrapartum Procedures: spontaneous vaginal delivery Postpartum Procedures: none Complications-Operative and Postpartum: vaginal laceration HEMOGLOBIN  Date Value Ref Range Status  08/21/2015 10.5* 12.0 - 15.0 g/dL Final   HCT  Date Value Ref Range Status  08/21/2015 29.1* 36.0 - 46.0 % Final    Physical Exam:  General: alert and cooperative Lochia: appropriate Uterine Fundus: firm Incision: healing well DVT Evaluation: No evidence of DVT seen on physical exam. Negative Homan's sign. No cords or calf tenderness. DTR's 1+, no clonus  Discharge Diagnoses: Term Pregnancy-delivered  Discharge Information: Date: 08/22/2015 Activity: pelvic rest Diet: routine Medications: PNV, Ibuprofen and Percocet Condition: stable Instructions: refer to practice specific booklet Discharge to: home. Reviewed signs and symptoms of PIH, patient to RTO in  1 week for bp check   Newborn Data: Live born female  Birth Weight: 5 lb 15.9 oz (2720 g) APGAR: 9, 9  Home with mother.  CURTIS,CAROL G 08/22/2015, 8:14 AM

## 2015-08-22 NOTE — Lactation Note (Signed)
This note was copied from the chart of Meghan Meghan Calhoun. Lactation Consultation Note  Patient Name: Meghan Calhoun GNFAO'ZToday's Date: 08/22/2015   Talked with mom in regards to feeding. Mom is eating lunch and planning to pump after eating. Infant has had 2 syringe feedings of 5 cc each at 10 and 12 N. Infant is now under double photo therapy. Infant will not be D/C today.     Maternal Data    Feeding Feeding Type: Formula  [facility] Score/Interventions                      Lactation Tools Discussed/Used     Consult Status      Ed BlalockSharon S Farhiya Rosten 08/22/2015, 12:37 PM

## 2015-08-23 ENCOUNTER — Ambulatory Visit: Payer: Self-pay

## 2015-08-23 NOTE — Lactation Note (Signed)
This note was copied from the chart of Meghan Howie IllKristen Vinluan. Lactation Consultation Note  Patient Name: Meghan Howie IllKristen Sabino ONGEX'BToday'Calhoun Date: 08/23/2015 Reason for consult: Follow-up assessment;Infant < 6lbs;Infant weight loss;Difficult latch   Follow up with first time mom of 60 hour old Meghan Calhoun. Infant with 10 bottle feeds of 3-34 cc, 2 syringe feeds of 5 cc each, 4 voids and 3 stools in last 24 hours. Infant Phototherapy D/C today. Infant with 9 % weight loss since birth. Infant is now eating better and taking larger volumes ad lib. Mom reports that infant is showing more interest in latching to the breast when she has tried. Enc her to keep trying at home. Mom reports she has been hand expressing and giving infant small amounts of colostrum with spoon also. Mom reports that she has ordered a breast pump from insurance, she is going to use her SIL new pump until her arrives. Enc mom to pump q 2-3 hours for 15 minutes followed by hand expression, feed infant EBM first followed by formula. Reviewed BF Information in Taking Care of Baby and Me Booklet.  Reviewed [facility] Brochure, patient aware of BF Resources, Support Groups and OP Services. Infant with follow up Ped appointment today and plans to call and change appointment until tomorrow. Enc mom to call with questions/concerns or if needs assistance with getting infant to latch.   Maternal Data Formula Feeding for Exclusion: No Has patient been taught Hand Expression?: Yes Does the patient have breastfeeding experience prior to this delivery?: No  Feeding Feeding Type: Bottle Fed - Formula  LATCH Score/Interventions                      Lactation Tools Discussed/Used Pump Review: Milk Storage;Setup, frequency, and cleaning   Consult Status Consult Status: Complete Follow-up type: Call as needed    Meghan Calhoun Dyllen Menning 08/23/2015, 9:39 AM

## 2015-08-23 NOTE — Lactation Note (Signed)
This note was copied from the chart of Meghan Howie IllKristen Nestor. Lactation Consultation Note Baby has 9% weight loss. On DPT, had 10 voids, 6 stools, 2 emesis. Mom supplementing w/small amount. Encouraged to BF, pump, hand express, give baby any colostrum obtained (only a few drops) then supplement according to hours of age. Mom was only giving 7ml. Mom states she understands about weight loss and the importance of I&O documentation, and stimulating baby to BF. Mom states baby tongue thrust out the breast as well as the bottle nipple. Explained how to do suck training to stimulate to suck.  Patient Name: Meghan Howie IllKristen Lueras WUJWJ'XToday's Date: 08/23/2015 Reason for consult: Follow-up assessment;Infant weight loss   Maternal Data    Feeding    LATCH Score/Interventions                Intervention(s): Breastfeeding basics reviewed     Lactation Tools Discussed/Used     Consult Status Consult Status: Follow-up Date: 08/24/15 Follow-up type: In-patient    Meghan Calhoun, Diamond NickelLAURA G 08/23/2015, 2:22 AM

## 2016-01-17 ENCOUNTER — Other Ambulatory Visit (HOSPITAL_COMMUNITY): Payer: Self-pay | Admitting: Internal Medicine

## 2016-01-17 DIAGNOSIS — R809 Proteinuria, unspecified: Secondary | ICD-10-CM

## 2016-01-23 ENCOUNTER — Ambulatory Visit (HOSPITAL_COMMUNITY)
Admission: RE | Admit: 2016-01-23 | Discharge: 2016-01-23 | Disposition: A | Discharge status: Home / Self Care | Payer: PRIVATE HEALTH INSURANCE | Source: Ambulatory Visit | Attending: Internal Medicine | Admitting: Internal Medicine

## 2016-01-23 DIAGNOSIS — R809 Proteinuria, unspecified: Secondary | ICD-10-CM | POA: Insufficient documentation

## 2016-01-23 DIAGNOSIS — R161 Splenomegaly, not elsewhere classified: Secondary | ICD-10-CM | POA: Insufficient documentation

## 2016-01-31 ENCOUNTER — Emergency Department (HOSPITAL_COMMUNITY): Payer: PRIVATE HEALTH INSURANCE

## 2016-01-31 ENCOUNTER — Emergency Department (HOSPITAL_COMMUNITY)
Admission: EM | Admit: 2016-01-31 | Discharge: 2016-01-31 | Disposition: A | Discharge status: Home / Self Care | Payer: PRIVATE HEALTH INSURANCE | Attending: Emergency Medicine | Admitting: Emergency Medicine

## 2016-01-31 ENCOUNTER — Encounter (HOSPITAL_COMMUNITY): Payer: Self-pay | Admitting: *Deleted

## 2016-01-31 DIAGNOSIS — Z79899 Other long term (current) drug therapy: Secondary | ICD-10-CM | POA: Insufficient documentation

## 2016-01-31 DIAGNOSIS — R2243 Localized swelling, mass and lump, lower limb, bilateral: Secondary | ICD-10-CM | POA: Diagnosis present

## 2016-01-31 DIAGNOSIS — N049 Nephrotic syndrome with unspecified morphologic changes: Secondary | ICD-10-CM | POA: Insufficient documentation

## 2016-01-31 DIAGNOSIS — I10 Essential (primary) hypertension: Secondary | ICD-10-CM | POA: Diagnosis not present

## 2016-01-31 DIAGNOSIS — R770 Abnormality of albumin: Secondary | ICD-10-CM | POA: Insufficient documentation

## 2016-01-31 DIAGNOSIS — E010 Iodine-deficiency related diffuse (endemic) goiter: Secondary | ICD-10-CM | POA: Diagnosis not present

## 2016-01-31 DIAGNOSIS — Z8619 Personal history of other infectious and parasitic diseases: Secondary | ICD-10-CM | POA: Insufficient documentation

## 2016-01-31 DIAGNOSIS — Z791 Long term (current) use of non-steroidal anti-inflammatories (NSAID): Secondary | ICD-10-CM | POA: Diagnosis not present

## 2016-01-31 DIAGNOSIS — R000 Tachycardia, unspecified: Secondary | ICD-10-CM | POA: Diagnosis not present

## 2016-01-31 HISTORY — DX: Essential (primary) hypertension: I10

## 2016-01-31 LAB — CBC WITH DIFFERENTIAL/PLATELET
Basophils Absolute: 0 K/uL (ref 0.0–0.1)
Basophils Relative: 0 %
Eosinophils Absolute: 0.1 K/uL (ref 0.0–0.7)
Eosinophils Relative: 2 %
HCT: 43.6 % (ref 36.0–46.0)
Hemoglobin: 15.5 g/dL — ABNORMAL HIGH (ref 12.0–15.0)
Lymphocytes Relative: 26 %
Lymphs Abs: 1.9 K/uL (ref 0.7–4.0)
MCH: 31.1 pg (ref 26.0–34.0)
MCHC: 35.6 g/dL (ref 30.0–36.0)
MCV: 87.4 fL (ref 78.0–100.0)
Monocytes Absolute: 0.7 K/uL (ref 0.1–1.0)
Monocytes Relative: 10 %
Neutro Abs: 4.4 K/uL (ref 1.7–7.7)
Neutrophils Relative %: 62 %
Platelets: 310 K/uL (ref 150–400)
RBC: 4.99 MIL/uL (ref 3.87–5.11)
RDW: 13.5 % (ref 11.5–15.5)
WBC: 7.1 K/uL (ref 4.0–10.5)

## 2016-01-31 LAB — URINALYSIS, ROUTINE W REFLEX MICROSCOPIC
BILIRUBIN URINE: NEGATIVE
Glucose, UA: NEGATIVE mg/dL
Ketones, ur: NEGATIVE mg/dL
NITRITE: NEGATIVE
Protein, ur: >300 mg/dL — AB
SPECIFIC GRAVITY, URINE: 1.014 (ref 1.005–1.030)
pH: 5.5 (ref 5.0–8.0)

## 2016-01-31 LAB — COMPREHENSIVE METABOLIC PANEL WITH GFR
ALT: 14 U/L (ref 14–54)
AST: 18 U/L (ref 15–41)
Albumin: <1 g/dL — ABNORMAL LOW (ref 3.5–5.0)
Alkaline Phosphatase: 61 U/L (ref 38–126)
Anion gap: 9 (ref 5–15)
BUN: <5 mg/dL — ABNORMAL LOW (ref 6–20)
CO2: 26 mmol/L (ref 22–32)
Calcium: 7.6 mg/dL — ABNORMAL LOW (ref 8.9–10.3)
Chloride: 104 mmol/L (ref 101–111)
Creatinine, Ser: 0.69 mg/dL (ref 0.44–1.00)
GFR calc Af Amer: >60 mL/min (ref >60)
GFR calc non Af Amer: >60 mL/min (ref >60)
Glucose, Bld: 102 mg/dL — ABNORMAL HIGH (ref 65–99)
Potassium: 3.8 mmol/L (ref 3.5–5.1)
Sodium: 139 mmol/L (ref 135–145)
Total Bilirubin: 0.3 mg/dL (ref 0.3–1.2)
Total Protein: 4.4 g/dL — ABNORMAL LOW (ref 6.5–8.1)

## 2016-01-31 LAB — PROTEIN / CREATININE RATIO, URINE
Creatinine, Urine: 46.95 mg/dL
Protein Creatinine Ratio: 15.31 mg/mg{Cre} — ABNORMAL HIGH (ref 0.00–0.15)
Total Protein, Urine: 719 mg/dL

## 2016-01-31 LAB — URINE MICROSCOPIC-ADD ON

## 2016-01-31 LAB — BRAIN NATRIURETIC PEPTIDE: B Natriuretic Peptide: 6.7 pg/mL (ref 0.0–100.0)

## 2016-01-31 LAB — PREGNANCY, URINE: Preg Test, Ur: NEGATIVE

## 2016-01-31 MED ORDER — FUROSEMIDE 40 MG PO TABS: 80.0000 mg | ORAL_TABLET | Freq: Two times a day (BID) | ORAL | Status: DC

## 2016-01-31 MED ORDER — POTASSIUM CHLORIDE ER 20 MEQ PO TBCR: 20.0000 meq | EXTENDED_RELEASE_TABLET | Freq: Two times a day (BID) | ORAL | Status: DC

## 2016-01-31 NOTE — ED Notes (Addendum)
Pt in from home c/o bil feet swelling xz 2 wks with her PCP recommending seeing a kidney specialist, pt reports L eye & L facial swelling onset yesterday without injury, pt reports a runny nose & watering eyes yesterday, pt denies SOB, n/v/d, pt reports takes Lasix daily 40 mg BID, pt hx of HTN, pt denies taking HTN meds at this time

## 2016-01-31 NOTE — ED Provider Notes (Signed)
CSN: [id number]     Arrival date & time 01/31/16  [id number] History   First MD Initiated Contact with Patient 01/31/16 0800     No chief complaint on file.     HPI Patient presents with swelling in her legs and now the left side of her face. She is 5 months postpartum and had preeclampsia with pregnancy. She states she was doing well for about 3 months after the pregnancy at around 2 months ago began to swell again. Has had swelling in her legs and now woke up a swelling on the left side of her face. No chest pain or trouble breathing. No fevers. No abdominal pain. Has had a normal renal ultrasound for this.   Past Medical History  Diagnosis Date  . Medical history non-contributory   . Hx of varicella   . Hypertension    Past Surgical History  Procedure Laterality Date  . Elbow surgery    . Mouth surgery     Family History  Problem Relation Age of Onset  . Hypertension Mother   . Anxiety disorder Sister    Social History  Substance Use Topics  . Smoking status: Never Smoker   . Smokeless tobacco: Never Used  . Alcohol Use: No     Comment: occas   OB History    Gravida Para Term Preterm AB TAB SAB Ectopic Multiple Living   1 1 1  0 0 0 0 0 0 1     Review of Systems  Constitutional: Negative for activity change and appetite change.  Eyes: Negative for pain.  Respiratory: Negative for chest tightness and shortness of breath.   Cardiovascular: Positive for leg swelling. Negative for chest pain.  Gastrointestinal: Negative for nausea, vomiting, abdominal pain and diarrhea.  Genitourinary: Negative for flank pain.  Musculoskeletal: Negative for back pain and neck stiffness.  Skin: Negative for rash.  Neurological: Negative for weakness, numbness and headaches.  Psychiatric/Behavioral: Negative for behavioral problems.      Allergies  Review of patient's allergies indicates no known allergies.  Home Medications   Prior to Admission medications   Medication Sig Start Date  End Date Taking? Authorizing Provider  ibuprofen (ADVIL,MOTRIN) 600 MG tablet Take 1 tablet (600 mg total) by mouth every 6 (six) hours. 08/22/15  Yes Julio Sicksarol Curtis, NP  furosemide (LASIX) 40 MG tablet Take 2 tablets (80 mg total) by mouth 2 (two) times daily. 01/31/16   Benjiman CoreNathan Cataleya Cristina, MD  oxyCODONE-acetaminophen (PERCOCET/ROXICET) 5-325 MG tablet Take 1 tablet by mouth every 4 (four) hours as needed (for pain scale 4-7). 08/22/15   Julio Sicksarol Curtis, NP  potassium chloride 20 MEQ TBCR Take 20 mEq by mouth 2 (two) times daily. 01/31/16   Benjiman CoreNathan Veleka Djordjevic, MD  Prenatal Vit-Fe Fumarate-FA (PRENATAL MULTIVITAMIN) TABS tablet Take 1 tablet by mouth daily at 12 noon.    Historical Provider, MD   BP 114/84 mmHg  Pulse 96  Temp(Src) 98.3 F (36.8 C) (Oral)  Resp 18  Ht 5\' 5"  (1.651 m)  Wt 199 lb 8 oz (90.493 kg)  BMI 33.20 kg/m2  SpO2 100%  LMP 01/07/2016  Breastfeeding? No Physical Exam  Constitutional: She appears well-developed.  HENT:  Swelling with edema of left side of face. No erythema.  Eyes: Pupils are equal, round, and reactive to light.  Neck: Thyromegaly present.  Cardiovascular:  Tachycardia  Pulmonary/Chest: Effort normal. She has no rales.  Abdominal: There is no tenderness.  Musculoskeletal: She exhibits edema.  Moderate edema bilateral lower extremities.  Neurological: She is alert.  Skin: Skin is warm.    ED Course  Procedures (including critical care time) Labs Review Labs Reviewed  COMPREHENSIVE METABOLIC PANEL - Abnormal; Notable for the following:    Glucose, Bld 102 (*)    BUN <5 (*)    Calcium 7.6 (*)    Total Protein 4.4 (*)    Albumin <1.0 (*)    All other components within normal limits  CBC WITH DIFFERENTIAL/PLATELET - Abnormal; Notable for the following:    Hemoglobin 15.5 (*)    All other components within normal limits  URINALYSIS, ROUTINE W REFLEX MICROSCOPIC (NOT AT [facility]) - Abnormal; Notable for the following:    APPearance CLOUDY (*)    Hgb urine  dipstick SMALL (*)    Protein, ur >300 (*)    Leukocytes, UA TRACE (*)    All other components within normal limits  PROTEIN / CREATININE RATIO, URINE - Abnormal; Notable for the following:    Protein Creatinine Ratio 15.31 (*)    All other components within normal limits  URINE MICROSCOPIC-ADD ON - Abnormal; Notable for the following:    Squamous Epithelial / LPF 6-30 (*)    Bacteria, UA MANY (*)    Casts GRANULAR CAST (*)    All other components within normal limits  URINE CULTURE  BRAIN NATRIURETIC PEPTIDE  PREGNANCY, URINE    Imaging Review Dg Chest 2 View  01/31/2016  CLINICAL DATA:  BILATERAL leg swelling, LEFT facial swelling, same symptoms 5 months ago when she had preeclampsia, now 5 months postpartum EXAM: CHEST  2 VIEW COMPARISON:  None FINDINGS: Normal heart size, mediastinal contours, and pulmonary vascularity. Mild peribronchial thickening. No pulmonary infiltrate, pleural effusion, or pneumothorax. Bones unremarkable. IMPRESSION: Mild bronchitic changes without infiltrate. Electronically Signed   By: Ulyses Southward M.D.   On: 01/31/2016 08:47   I have personally reviewed and evaluated these images and lab results as part of my medical decision-making.   EKG Interpretation   Date/Time:  Thursday January 31 2016 09:47:23 EDT Ventricular Rate:  98 PR Interval:  127 QRS Duration: 84 QT Interval:  337 QTC Calculation: 430 R Axis:   101 Text Interpretation:  Sinus rhythm Borderline right axis deviation  Borderline low voltage, extremity leads Confirmed by Rubin Payor  MD, Gailya Tauer  [phone]) on 01/31/2016 10:29:57 AM      MDM   Final diagnoses:  Nephrotic syndrome    Patient with nephrotic syndrome. Albumin less than 1. Hemodynamically stable but mild tachycardia. Discussed with Dr. Briant Cedar, and patient will follow-up as an outpatient. Will increase Lasix dose.   Benjiman Core, MD 01/31/16 1534

## 2016-01-31 NOTE — Discharge Instructions (Signed)
Nephrotic Syndrome Nephrotic syndrome is set of findings that show there is a problem with the kidneys. These findings include:   High levels of protein in urine (proteinuria).   High blood pressure (hypertension).   Low levels of the protein albumin in the blood (hypoalbuminemia).   High levels of cholesterol (hyperlipidemia) and triglycerides (hypertriglyceridemia) in the blood  Swelling of face, abdomen, arms and legs (edema). Nephrotic syndrome occurs when the kidneys' filters (glomeruli) are damaged. Glomeruli remove toxins and waste products from the bloodstream. As a result of damaged glomeruli, essential products such as proteins may also be removed from the bloodstream. The loss of proteins and other substances the body needs causes nephrotic syndrome. Nephrotic syndrome may increase your risk of further kidney damage and of health problems such as blood clots and infection.  CAUSES   A kidney disease that damages the glomeruli, such as:  Minimal change disease.   Focal segmental glomerulosclerosis.   Membranous nephropathy.   Glomerulonephritis.   A condition or disease that affects other parts of the body (systemic), such as:  Diabetes.  Autoimmune diseases, such as lupus.  Amyloidosis.  Multiple myeloma.  Some types of cancers.  An infection, such as hepatitis C.  Medicines such as:  Nonsteroidal anti-inflammatory drugs (NSAIDs).  Some anticancer drugs. In some cases, the cause of nephrotic syndrome is not known.  SYMPTOMS  You may not have noticeable symptoms. If symptoms are present, they may include:   Edema.  Foamy urine.  Unexplained weight gain.  Loss of appetite.  DIAGNOSIS  Nephrotic syndrome is usually diagnosed with dipstick urine test or a 24-hour urine collection. If your test shows that you have nephrotic syndrome, additional tests may be needed to determine its cause. These may include blood, urine, imaging, or kidney biopsy  tests.  TREATMENT  You may receive medicines to treat symptoms or to prevent complications from occurring. These medicines may:   Decrease inflammation in the kidneys.  Lower blood pressure.  Lower cholesterol.  Reduce the blood's ability to clot.  Help control edema. Further treatment will depend on the cause of your nephrotic syndrome. Your caregiver will discuss treatment options with you.  HOME CARE INSTRUCTIONS   Follow your prescribed diet.  Only take medicines as directed by your caregiver.  Do not take any medicines (including prescription medicines, over-the-counter medicines, or nutritional supplements) unless approved by your caregiver. Many medicines can make nephrotic syndrome worse or need to have the dose adjusted.  Keep all follow-up appointments as directed by your caregiver. SEEK MEDICAL CARE IF: Your symptoms do not go away as expected or you develop new symptoms.    This information is not intended to replace advice given to you by your health care provider. Make sure you discuss any questions you have with your health care provider.   Document Released: 09/05/2004 Document Revised: 07/07/2012 Document Reviewed: 05/18/2012 Elsevier Interactive Patient Education [employer].

## 2016-01-31 NOTE — ED Notes (Signed)
Pt is in stable condition upon d/c and ambulates from ED. 

## 2016-01-31 NOTE — ED Notes (Signed)
Phlebotomy at the bedside  

## 2016-02-01 LAB — URINE CULTURE

## 2016-02-18 ENCOUNTER — Other Ambulatory Visit (HOSPITAL_COMMUNITY): Payer: Self-pay | Admitting: Nephrology

## 2016-02-18 DIAGNOSIS — N049 Nephrotic syndrome with unspecified morphologic changes: Secondary | ICD-10-CM

## 2016-02-21 ENCOUNTER — Other Ambulatory Visit: Payer: Self-pay | Admitting: Radiology

## 2016-02-22 ENCOUNTER — Other Ambulatory Visit: Payer: Self-pay | Admitting: Radiology

## 2016-02-25 ENCOUNTER — Encounter (HOSPITAL_COMMUNITY): Payer: Self-pay

## 2016-02-25 ENCOUNTER — Ambulatory Visit (HOSPITAL_COMMUNITY)
Admission: RE | Admit: 2016-02-25 | Discharge: 2016-02-25 | Disposition: A | Discharge status: Home / Self Care | Payer: PRIVATE HEALTH INSURANCE | Source: Ambulatory Visit | Attending: Nephrology | Admitting: Nephrology

## 2016-02-25 DIAGNOSIS — N049 Nephrotic syndrome with unspecified morphologic changes: Secondary | ICD-10-CM | POA: Diagnosis present

## 2016-02-25 DIAGNOSIS — N052 Unspecified nephritic syndrome with diffuse membranous glomerulonephritis: Secondary | ICD-10-CM | POA: Insufficient documentation

## 2016-02-25 DIAGNOSIS — Z79899 Other long term (current) drug therapy: Secondary | ICD-10-CM | POA: Insufficient documentation

## 2016-02-25 DIAGNOSIS — Z8249 Family history of ischemic heart disease and other diseases of the circulatory system: Secondary | ICD-10-CM | POA: Diagnosis not present

## 2016-02-25 LAB — CBC
HEMATOCRIT: 40 % (ref 36.0–46.0)
HEMOGLOBIN: 14.1 g/dL (ref 12.0–15.0)
MCH: 30.8 pg (ref 26.0–34.0)
MCHC: 35.3 g/dL (ref 30.0–36.0)
MCV: 87.3 fL (ref 78.0–100.0)
Platelets: 349 10*3/uL (ref 150–400)
RBC: 4.58 MIL/uL (ref 3.87–5.11)
RDW: 13.1 % (ref 11.5–15.5)
WBC: 8.4 10*3/uL (ref 4.0–10.5)

## 2016-02-25 LAB — APTT: APTT: 32 s (ref 24–37)

## 2016-02-25 LAB — PROTIME-INR
INR: 1.09 (ref 0.00–1.49)
Prothrombin Time: 14.3 seconds (ref 11.6–15.2)

## 2016-02-25 LAB — PREGNANCY, URINE: Preg Test, Ur: NEGATIVE

## 2016-02-25 MED ORDER — FENTANYL CITRATE (PF) 100 MCG/2ML IJ SOLN
INTRAMUSCULAR | Status: AC
  Filled 2016-02-25: qty 2

## 2016-02-25 MED ORDER — SODIUM CHLORIDE 0.9 % IV SOLN: Freq: Once | INTRAVENOUS | Status: DC

## 2016-02-25 MED ORDER — FENTANYL CITRATE (PF) 100 MCG/2ML IJ SOLN
INTRAMUSCULAR | Status: AC | PRN
  Administered 2016-02-25: 25 ug via INTRAVENOUS
  Administered 2016-02-25: 50 ug via INTRAVENOUS

## 2016-02-25 MED ORDER — MIDAZOLAM HCL 2 MG/2ML IJ SOLN
INTRAMUSCULAR | Status: AC
  Filled 2016-02-25: qty 2

## 2016-02-25 MED ORDER — SODIUM CHLORIDE 0.9 % IV SOLN
INTRAVENOUS | Status: AC | PRN
  Administered 2016-02-25: 10 mL/h via INTRAVENOUS

## 2016-02-25 MED ORDER — LIDOCAINE HCL (PF) 1 % IJ SOLN
INTRAMUSCULAR | Status: AC
  Filled 2016-02-25: qty 10

## 2016-02-25 MED ORDER — GELATIN ABSORBABLE 12-7 MM EX MISC
CUTANEOUS | Status: AC
  Filled 2016-02-25: qty 1

## 2016-02-25 MED ORDER — MIDAZOLAM HCL 2 MG/2ML IJ SOLN
INTRAMUSCULAR | Status: AC | PRN
  Administered 2016-02-25: 1 mg via INTRAVENOUS
  Administered 2016-02-25: 0.5 mg via INTRAVENOUS

## 2016-02-25 NOTE — Discharge Instructions (Signed)
Needle Biopsy, Care After Refer to this sheet in the next few weeks. These instructions provide you with information about caring for yourself after your procedure. Your health care provider may also give you more specific instructions. Your treatment has been planned according to current medical practices, but problems sometimes occur. Call your health care provider if you have any problems or questions after your procedure. WHAT TO EXPECT AFTER THE PROCEDURE After your procedure, it is common to have soreness, bruising, or mild pain at the biopsy site. This should go away in a few days. HOME CARE INSTRUCTIONS  Rest as directed by your health care provider.  Take medicines only as directed by your health care provider.  There are many different ways to close and cover the biopsy site, including stitches (sutures), skin glue, and adhesive strips. Follow your health care provider's instructions about:  Biopsy site care.  Bandage (dressing) changes and removal.  Biopsy site closure removal.  Check your biopsy site every day for signs of infection. Watch for:  Redness, swelling, or pain.  Fluid, blood, or pus. SEEK MEDICAL CARE IF:  You have a fever.  You have redness, swelling, or pain at the biopsy site that lasts longer than a few days.  You have fluid, blood, or pus coming from the biopsy site.  You feel nauseous.  You vomit. SEEK IMMEDIATE MEDICAL CARE IF:  You have shortness of breath.  You have trouble breathing.  You have chest pain.   You feel dizzy or you faint.  You have bleeding that does not stop with pressure or a bandage.  You cough up blood.  You have pain in your abdomen.   This information is not intended to replace advice given to you by your health care provider. Make sure you discuss any questions you have with your health care provider.   Document Released: 02/27/2015 Document Reviewed: 02/27/2015 Elsevier Interactive Patient Education 2016  Elsevier Inc.   Kidney Biopsy, Care After Refer to this sheet in the next few weeks. These instructions provide you with information on caring for yourself after your procedure. Your health care provider may also give you more specific instructions. Your treatment has been planned according to current medical practices, but problems sometimes occur. Call your health care provider if you have any problems or questions after your procedure.  WHAT TO EXPECT AFTER THE PROCEDURE   You may notice blood in the urine for the first 24 hours after the biopsy.  You may feel some pain at the biopsy site for 1-2 weeks after the biopsy. HOME CARE INSTRUCTIONS  Do not lift anything heavier than 10 lb (4.5 kg) for 2 weeks.  Do not take any non-steroidal anti-inflammatory drugs (NSAIDs) or any blood thinners for a week after the biopsy unless instructed to do so by your health care provider.  Only take medicines for pain, fever, or discomfort as directed by your health care provider. SEEK MEDICAL CARE IF:  You have bloody urine more than 24 hours after the biopsy.   You develop a fever.   You cannot urinate.   You have increasing pain at the biopsy site.  SEEK IMMEDIATE MEDICAL CARE IF: You feel faint or dizzy.    This information is not intended to replace advice given to you by your health care provider. Make sure you discuss any questions you have with your health care provider.   Document Released: 06/15/2013 Document Reviewed: 06/15/2013 Elsevier Interactive Patient Education [employer]  Return to Work [employer]_Kristen Dunn___ was treated at our facility. INJURY OR ILLNESS WAS: _____ Work related ___X__ Not work related _____ Undetermined if work related RETURN TO WORK  Employee may return to work on _Monday May 8, 2017_.  Employee may return to modified work on __Thursday May 4, 2017___. WORK ACTIVITY  RESTRICTIONS Work activities that are not tolerated include: _____ Bending _____ Prolonged sitting ___X__ Lifting more than 10_ lb _____ Squatting _____ Prolonged standing _____ Climbing _____ Reaching _____ Pushing and pulling _____ Walking _____ Other ____________________ These restrictions are effective until _Monday May 8, 2017__. Show this Return to Work statement to your supervisor at work as soon as possible. Your employer should be aware of your condition and can help with the necessary work activity restrictions. If you wish to return to work sooner than the date that is listed above, or if you have further problems that make it difficult for you to return at that time, please call our clinic or your health care provider.___ Health Care Provider Name (printed)  Dr. Ruthy DickJamie Wagner _________________________________________ [facility] Provider (signature)  _05/01/2017__ Date   This information is not intended to replace advice given to you by your health care provider. Make sure you discuss any questions you have with your health care provider.   Document Released: 10/13/2005 Document Revised: 11/03/2014 Document Reviewed: 05/12/2014 Elsevier Interactive Patient Education [employer] Elsevier Inc.

## 2016-02-25 NOTE — H&P (Signed)
Chief Complaint: Patient was seen in consultation today for random renal biopsy at the request of Meghan Calhoun  Referring Physician(s): Meghan Calhoun  Supervising Physician: Meghan Calhoun  Patient Status: Out-pt  History of Present Illness: Meghan Calhoun is Calhoun 25 y.o. female   Pt has had lower extremity edema noted 3-4 months Presented to ED [facility] 01/31/2016 secondary facial edema Hx preeclampsia (now 5 mo post partum) +proteinuria Nephrotic syndrome Referred to Dr Meghan Calhoun  Now scheduled for random renal biopsy  Past Medical History  Diagnosis Date  . Medical history non-contributory   . Hx of varicella   . Hypertension     During pregnancy    Past Surgical History  Procedure Laterality Date  . Elbow surgery    . Mouth surgery      Allergies: Review of patient's allergies indicates no known allergies.  Medications: Prior to Admission medications   Medication Sig Start Date End Date Taking? Authorizing Provider  furosemide (LASIX) 40 MG tablet Take 2 tablets (80 mg total) by mouth 2 (two) times daily. 01/31/16  Yes Meghan Core, MD  lisinopril (PRINIVIL,ZESTRIL) 10 MG tablet Take 10 mg by mouth daily.   Yes Historical Provider, MD  potassium chloride 20 MEQ TBCR Take 20 mEq by mouth 2 (two) times daily. 01/31/16  Yes Meghan Core, MD  pravastatin (PRAVACHOL) 40 MG tablet Take 40 mg by mouth daily.   Yes Historical Provider, MD     Family History  Problem Relation Age of Onset  . Hypertension Mother   . Anxiety disorder Sister     Social History   Social History  . Marital Status: Married    Spouse Name: N/Calhoun  . Number of Children: N/Calhoun  . Years of Education: N/Calhoun   Social History Main Topics  . Smoking status: Never Smoker   . Smokeless tobacco: Never Used  . Alcohol Use: No     Comment: occas  . Drug Use: No  . Sexual Activity: Yes    Birth Control/ Protection: None   Other Topics Concern  . None   Social History Narrative   **  Merged History Encounter **         Review of Systems: Calhoun 12 point ROS discussed and pertinent positives are indicated in the HPI above.  All other systems are negative.  Review of Systems  Constitutional: Positive for activity change. Negative for fever, appetite change and fatigue.  Respiratory: Negative for shortness of breath.   Cardiovascular: Positive for leg swelling.  Gastrointestinal: Negative for abdominal pain.  Genitourinary: Negative for difficulty urinating.  Neurological: Negative for weakness.  Psychiatric/Behavioral: Negative for behavioral problems and confusion.    Vital Signs: BP 130/82 mmHg  Pulse 92  Temp(Src) 98.3 F (36.8 C) (Oral)  Resp 20  Ht  (1.651 m)  Wt 179 lb (81.194 kg)  BMI 29.79 kg/m2  SpO2 100%  LMP 02/25/2016  Physical Exam  Constitutional: She is oriented to person, place, and time. She appears well-nourished.  Cardiovascular: Normal rate, regular rhythm and normal heart sounds.   Pulmonary/Chest: Effort normal and breath sounds normal. She has no wheezes.  Abdominal: Soft. Bowel sounds are normal. There is no tenderness.  Musculoskeletal: Normal range of motion.  Neurological: She is alert and oriented to person, place, and time.  Skin: Skin is warm and dry.  Psychiatric: She has Calhoun normal mood and affect. Her behavior is normal. Judgment and thought content normal.  Nursing note and vitals reviewed.  Mallampati Score:  MD Evaluation Airway: WNL Heart: WNL Abdomen: WNL Chest/ Lungs: WNL ASA  Classification: 2 Mallampati/Airway Score: Two  Imaging: Dg Chest 2 View  01/31/2016  CLINICAL DATA:  BILATERAL leg swelling, LEFT facial swelling, same symptoms 5 months ago when she had preeclampsia, now 5 months postpartum EXAM: CHEST  2 VIEW COMPARISON:  None FINDINGS: Normal heart size, mediastinal contours, and pulmonary vascularity. Mild peribronchial thickening. No pulmonary infiltrate, pleural effusion, or pneumothorax. Bones  unremarkable. IMPRESSION: Mild bronchitic changes without infiltrate. Electronically Signed   By: Ulyses SouthwardMark  Boles M.D.   On: 01/31/2016 08:47    Labs:  CBC:  Recent Labs  08/20/15 1043 08/21/15 0545 08/21/15 1940 01/31/16 0814  WBC 9.3 16.0* 12.7* 7.1  HGB 12.4 10.5* 10.5* 15.5*  HCT 34.2* 29.0* 29.1* 43.6  PLT 240 221 210 310    COAGS:  Recent Labs  02/25/16 0625  INR 1.09  APTT 32    BMP:  Recent Labs  08/20/15 0150 08/21/15 0545 08/21/15 1940 01/31/16 0814  NA 137 135 137 139  K 3.1* 3.3* 3.2* 3.8  CL 110 110 108 104  CO2 22 24 26 26   GLUCOSE 88 110* 89 102*  BUN 6 5* 8 <5*  CALCIUM 7.1* 7.0* 7.5* 7.6*  CREATININE 0.43* 0.50 0.51 0.69  GFRNONAA >60 >60 >60 >60  GFRAA >60 >60 >60 >60    LIVER FUNCTION TESTS:  Recent Labs  08/20/15 0150 08/21/15 0545 08/21/15 1940 01/31/16 0814  BILITOT 0.4 0.4 0.4 0.3  AST 15 15 20 18   ALT 13* 10* 14 14  ALKPHOS 93 76 80 61  PROT 4.5* 4.3* 4.6* 4.4*  ALBUMIN 1.2* 1.1* 1.3* <1.0*    TUMOR MARKERS: No results for input(s): AFPTM, CEA, CA199, CHROMGRNA in the last 8760 hours.  Assessment and Plan:  Nephrotic syndrome Scheduled for random renal biopsy Risks and Benefits discussed with the patient including, but not limited to bleeding, infection, damage to adjacent structures or low yield requiring additional tests. All of the patient's questions were answered, patient is agreeable to proceed. Consent signed and in chart.   Thank you for this interesting consult.  I greatly enjoyed meeting Meghan Calhoun and look forward to participating in their care.  Calhoun copy of this report was sent to the requesting provider on this date.  Electronically Signed: Leyan Branden Calhoun 02/25/2016, 7:24 AM   I spent Calhoun total of  30 minutes   in face to face in clinical consultation, greater than 50% of which was counseling/coordinating care for random renal bx

## 2016-02-25 NOTE — Procedures (Signed)
Interventional Radiology Procedure Note  Procedure: US guided biopsy of[place] left kidney, medical renal.  3 x 16g biopsy  Complications: None Recommendations:  - 3 hour recover, supine - regular diet - Ok to shower tomorrow - Do not submerge for 7 days - Routine care   Signed,  Yvone NeuJaime S. Loreta AveWagner, DO

## 2016-04-17 ENCOUNTER — Encounter (HOSPITAL_COMMUNITY): Payer: Self-pay

## 2016-10-27 NOTE — L&D Delivery Note (Signed)
Delivery Note At  a viable female was delivered via svd over intact perineum  (Presentation:ROA ;  ).  APGAR: 8/9 ; weight not yet done  .   Placenta status: delivered spontaneously, intact  Cord: loose nuchal, easily reduced; true knot noted;    Anesthesia: epidural   Episiotomy:  none Lacerations:   small 1st degree Suture Repair: 2.0 vicryl, hemostatic after repair Est. Blood Loss (mL):    Mom to postpartum.  Baby to Couplet care / Skin to Skin.  [name] 07/28/2017, 8:27 AM

## 2017-01-21 LAB — OB RESULTS CONSOLE GC/CHLAMYDIA
CHLAMYDIA, DNA PROBE: NEGATIVE
Gonorrhea: NEGATIVE

## 2017-01-21 LAB — OB RESULTS CONSOLE HIV ANTIBODY (ROUTINE TESTING): HIV: NONREACTIVE

## 2017-01-21 LAB — OB RESULTS CONSOLE RPR: RPR: NONREACTIVE

## 2017-01-21 LAB — OB RESULTS CONSOLE RUBELLA ANTIBODY, IGM: Rubella: IMMUNE

## 2017-01-21 LAB — OB RESULTS CONSOLE ABO/RH: RH TYPE: POSITIVE

## 2017-01-21 LAB — OB RESULTS CONSOLE HEPATITIS B SURFACE ANTIGEN: HEP B S AG: NEGATIVE

## 2017-01-21 LAB — OB RESULTS CONSOLE ANTIBODY SCREEN: Antibody Screen: NEGATIVE

## 2017-06-06 ENCOUNTER — Inpatient Hospital Stay (HOSPITAL_COMMUNITY)
Admission: AD | Admit: 2017-06-06 | Discharge: 2017-06-06 | Disposition: A | Discharge status: Home / Self Care | Payer: 59 | Source: Ambulatory Visit | Attending: Obstetrics and Gynecology | Admitting: Obstetrics and Gynecology

## 2017-06-06 ENCOUNTER — Encounter: Payer: Self-pay | Admitting: Student

## 2017-06-06 DIAGNOSIS — O26893 Other specified pregnancy related conditions, third trimester: Secondary | ICD-10-CM | POA: Diagnosis not present

## 2017-06-06 DIAGNOSIS — R109 Unspecified abdominal pain: Secondary | ICD-10-CM | POA: Insufficient documentation

## 2017-06-06 DIAGNOSIS — Z8249 Family history of ischemic heart disease and other diseases of the circulatory system: Secondary | ICD-10-CM | POA: Diagnosis not present

## 2017-06-06 DIAGNOSIS — Z818 Family history of other mental and behavioral disorders: Secondary | ICD-10-CM | POA: Diagnosis not present

## 2017-06-06 DIAGNOSIS — Z3A29 29 weeks gestation of pregnancy: Secondary | ICD-10-CM | POA: Insufficient documentation

## 2017-06-06 DIAGNOSIS — R102 Pelvic and perineal pain: Secondary | ICD-10-CM | POA: Diagnosis present

## 2017-06-06 HISTORY — DX: Unspecified nephritic syndrome with unspecified morphologic changes: N05.9

## 2017-06-06 HISTORY — DX: Chronic kidney disease, unspecified: N18.9

## 2017-06-06 LAB — URINALYSIS, ROUTINE W REFLEX MICROSCOPIC
BILIRUBIN URINE: NEGATIVE
Glucose, UA: NEGATIVE mg/dL
Hgb urine dipstick: NEGATIVE
KETONES UR: NEGATIVE mg/dL
NITRITE: NEGATIVE
PH: 7 (ref 5.0–8.0)
Protein, ur: NEGATIVE mg/dL
SPECIFIC GRAVITY, URINE: 1.004 — AB (ref 1.005–1.030)

## 2017-06-06 NOTE — MAU Provider Note (Signed)
History   G2P1001 @ 29.4 wks in with c/o sharp sharp shooting pain down into vagina x 4hrs.denies rom or vag bleeding. States good fetal movement.  CSN: [id number]  Arrival date & time 06/06/17  2047   None     Chief Complaint  Patient presents with  . Contractions    HPI  Past Medical History:  Diagnosis Date  . Chronic kidney disease   . Glomerulonephritis   . Hx of varicella   . Hypertension    During pregnancy  . Medical history non-contributory     Past Surgical History:  Procedure Laterality Date  . ELBOW SURGERY    . MOUTH SURGERY      Family History  Problem Relation Age of Onset  . Hypertension Mother   . Anxiety disorder Sister     Social History  Substance Use Topics  . Smoking status: Never Smoker  . Smokeless tobacco: Never Used  . Alcohol use No     Comment: occas    OB History    Gravida Para Term Preterm AB Living   2 1 1  0 0 1   SAB TAB Ectopic Multiple Live Births   0 0 0 0 1      Review of Systems  Constitutional: Negative.   HENT: Negative.   Eyes: Negative.   Respiratory: Negative.   Cardiovascular: Negative.   Gastrointestinal: Positive for abdominal pain.  Endocrine: Negative.   Genitourinary: Positive for vaginal pain.  Musculoskeletal: Negative.   Skin: Negative.   Allergic/Immunologic: Negative.   Neurological: Negative.   Hematological: Negative.   Psychiatric/Behavioral: Negative.     Allergies  Patient has no known allergies.  Home Medications    BP 135/79   Pulse (!) 112   Temp 98.7 F (37.1 C)   Resp 18   Ht 5\' 5"  (1.651 m)   Wt 203 lb (92.1 kg)   BMI 33.78 kg/m   Physical Exam  Constitutional: She is oriented to person, place, and time. She appears well-developed and well-nourished.  HENT:  Head: Normocephalic.  Eyes: Pupils are equal, round, and reactive to light.  Neck: Normal range of motion.  Cardiovascular: Normal rate, regular rhythm, normal heart sounds and intact distal pulses.    Pulmonary/Chest: Effort normal and breath sounds normal.  Abdominal: Soft. Bowel sounds are normal.  Genitourinary: Vagina normal and uterus normal.  Musculoskeletal: Normal range of motion.  Neurological: She is alert and oriented to person, place, and time. She has normal reflexes.  Skin: Skin is warm and dry.  Psychiatric: She has a normal mood and affect. Her behavior is normal. Judgment and thought content normal.    MAU Course  Procedures (including critical care time)  Labs Reviewed  URINALYSIS, ROUTINE W REFLEX MICROSCOPIC   No results found.   1. Abdominal pain in pregnancy, third trimester       MDM  VSS, SVE firm/cl/th/post/high. FHR pattern reassuring, no uc's. POC discussed POC with Dr. Amado NashAlmquist. Pt to be d/c home

## 2017-06-06 NOTE — Progress Notes (Signed)
Zerita Boersarlene Lawson CNM in earlier to discuss d/c plan. Written and verbal d/c instructions given and understanding voiced.

## 2017-06-06 NOTE — MAU Note (Signed)
Pt report she has been having lower abd back cramping on and off for about 4 hour. Denies and new vaginal discharge. Denies bleeding.Good fetal movement reported.

## 2017-06-06 NOTE — Discharge Instructions (Signed)

## 2017-06-27 ENCOUNTER — Inpatient Hospital Stay (HOSPITAL_COMMUNITY)
Admission: AD | Admit: 2017-06-27 | Discharge: 2017-06-27 | Disposition: A | Discharge status: Home / Self Care | Payer: 59 | Source: Ambulatory Visit | Attending: Obstetrics and Gynecology | Admitting: Obstetrics and Gynecology

## 2017-06-27 ENCOUNTER — Encounter (HOSPITAL_COMMUNITY): Payer: Self-pay

## 2017-06-27 DIAGNOSIS — I129 Hypertensive chronic kidney disease with stage 1 through stage 4 chronic kidney disease, or unspecified chronic kidney disease: Secondary | ICD-10-CM | POA: Diagnosis not present

## 2017-06-27 DIAGNOSIS — O133 Gestational [pregnancy-induced] hypertension without significant proteinuria, third trimester: Secondary | ICD-10-CM | POA: Diagnosis not present

## 2017-06-27 DIAGNOSIS — O26833 Pregnancy related renal disease, third trimester: Secondary | ICD-10-CM | POA: Insufficient documentation

## 2017-06-27 DIAGNOSIS — Z3A32 32 weeks gestation of pregnancy: Secondary | ICD-10-CM | POA: Diagnosis not present

## 2017-06-27 DIAGNOSIS — R109 Unspecified abdominal pain: Secondary | ICD-10-CM | POA: Insufficient documentation

## 2017-06-27 DIAGNOSIS — N189 Chronic kidney disease, unspecified: Secondary | ICD-10-CM | POA: Insufficient documentation

## 2017-06-27 DIAGNOSIS — O26893 Other specified pregnancy related conditions, third trimester: Secondary | ICD-10-CM | POA: Diagnosis not present

## 2017-06-27 HISTORY — DX: Gestational (pregnancy-induced) hypertension without significant proteinuria, unspecified trimester: O13.9

## 2017-06-27 LAB — URINALYSIS, ROUTINE W REFLEX MICROSCOPIC
Bilirubin Urine: NEGATIVE
Glucose, UA: NEGATIVE mg/dL
Hgb urine dipstick: NEGATIVE
Ketones, ur: NEGATIVE mg/dL
NITRITE: NEGATIVE
PH: 7 (ref 5.0–8.0)
Protein, ur: 30 mg/dL — AB
SPECIFIC GRAVITY, URINE: 1.01 (ref 1.005–1.030)

## 2017-06-27 NOTE — MAU Note (Signed)
Reports ctx since 0200 that have been getting stronger and more frequent over the past few hours. +fetal movement (RN observed fetal movement). No vaginal bleeding, no LOF. Reports PIH and takes labetolol at home- has not taken today.

## 2017-06-27 NOTE — Discharge Instructions (Signed)

## 2017-06-27 NOTE — MAU Provider Note (Signed)
History   G2P1001 @ 32.4 wks in with c/o abd pain since 0200, denies ROM or vag bleeding.  CSN: [id number]  Arrival date & time 06/27/17  1119   None     Chief Complaint  Patient presents with  . Contractions    HPI  Past Medical History:  Diagnosis Date  . Chronic kidney disease   . Glomerulonephritis   . Hx of varicella   . Hypertension    During pregnancy  . Medical history non-contributory   . Pregnancy induced hypertension     Past Surgical History:  Procedure Laterality Date  . ELBOW SURGERY    . MOUTH SURGERY      Family History  Problem Relation Age of Onset  . Hypertension Mother   . Anxiety disorder Sister     Social History  Substance Use Topics  . Smoking status: Never Smoker  . Smokeless tobacco: Never Used  . Alcohol use No     Comment: occas    OB History    Gravida Para Term Preterm AB Living   2 1 1  0 0 1   SAB TAB Ectopic Multiple Live Births   0 0 0 0 1      Review of Systems  Constitutional: Negative.   HENT: Negative.   Eyes: Negative.   Respiratory: Negative.   Cardiovascular: Negative.   Gastrointestinal: Positive for abdominal pain.  Endocrine: Negative.   Genitourinary: Negative.   Musculoskeletal: Negative.   Skin: Negative.   Allergic/Immunologic: Negative.   Neurological: Negative.   Hematological: Negative.   Psychiatric/Behavioral: Negative.     Allergies  Patient has no known allergies.  Home Medications    BP (!) 141/82   Pulse (!) 115   Temp 98.4 F (36.9 C) (Oral)   Resp 18   Physical Exam  Constitutional: She is oriented to person, place, and time. She appears well-developed and well-nourished.  HENT:  Head: Normocephalic.  Eyes: Pupils are equal, round, and reactive to light.  Neck: Normal range of motion.  Cardiovascular: Normal rate, normal heart sounds and intact distal pulses.   Pulmonary/Chest: Effort normal and breath sounds normal.  Abdominal: Soft. Bowel sounds are normal.   Genitourinary: Vagina normal and uterus normal.  Musculoskeletal: Normal range of motion.  Neurological: She is alert and oriented to person, place, and time. She has normal reflexes.  Skin: Skin is warm and dry.  Psychiatric: She has a normal mood and affect. Her behavior is normal. Judgment and thought content normal.    MAU Course  Procedures (including critical care time)  Labs Reviewed  URINALYSIS, ROUTINE W REFLEX MICROSCOPIC - Abnormal; Notable for the following:       Result Value   APPearance HAZY (*)    Protein, ur 30 (*)    Leukocytes, UA LARGE (*)    Bacteria, UA RARE (*)    Squamous Epithelial / LPF 6-30 (*)    All other components within normal limits   No results found.   1. Abdominal pain in pregnancy, third trimester       MDM  VSS, FHR pattern reassuring with 15x15 accels no decels. No uc's, SVE cl/th/post/high. POC discussed with Dr. Amado NashAlmquist to d/c pt home..Marland Kitchen

## 2017-07-12 ENCOUNTER — Inpatient Hospital Stay (HOSPITAL_COMMUNITY)
Admission: AD | Admit: 2017-07-12 | Discharge: 2017-07-12 | Discharge status: Against Medical Advice | Payer: 59 | Source: Ambulatory Visit | Attending: Obstetrics and Gynecology | Admitting: Obstetrics and Gynecology

## 2017-07-12 DIAGNOSIS — Z3A34 34 weeks gestation of pregnancy: Secondary | ICD-10-CM | POA: Diagnosis not present

## 2017-07-12 DIAGNOSIS — O163 Unspecified maternal hypertension, third trimester: Secondary | ICD-10-CM | POA: Insufficient documentation

## 2017-07-12 DIAGNOSIS — Z5321 Procedure and treatment not carried out due to patient leaving prior to being seen by health care provider: Secondary | ICD-10-CM | POA: Diagnosis not present

## 2017-07-12 LAB — URINALYSIS, ROUTINE W REFLEX MICROSCOPIC
Bilirubin Urine: NEGATIVE
Glucose, UA: NEGATIVE mg/dL
Hgb urine dipstick: NEGATIVE
KETONES UR: NEGATIVE mg/dL
Nitrite: NEGATIVE
PH: 7 (ref 5.0–8.0)
Protein, ur: 30 mg/dL — AB
Specific Gravity, Urine: 1.012 (ref 1.005–1.030)

## 2017-07-12 LAB — PROTEIN / CREATININE RATIO, URINE
Creatinine, Urine: 128 mg/dL
PROTEIN CREATININE RATIO: 0.28 mg/mg{creat} — AB (ref 0.00–0.15)
TOTAL PROTEIN, URINE: 36 mg/dL

## 2017-07-12 NOTE — MAU Note (Signed)
Patient not in lobby

## 2017-07-12 NOTE — MAU Note (Signed)
Patient not in lobby when called

## 2017-07-12 NOTE — MAU Note (Signed)
When went to BR at 0100 b/p was 175/101 and took extra labetalol. Went back to bed. Woke up at 0230 and threw up. B/P was 150s/102. Went to BR at 0340 and no change in b/p. Have h/a which I always have.

## 2017-07-12 NOTE — MAU Note (Signed)
Not in lobby when called 

## 2017-07-14 LAB — OB RESULTS CONSOLE GBS: STREP GROUP B AG: NEGATIVE

## 2017-07-22 ENCOUNTER — Telehealth (HOSPITAL_COMMUNITY): Payer: Self-pay | Admitting: *Deleted

## 2017-07-22 ENCOUNTER — Encounter (HOSPITAL_COMMUNITY): Payer: Self-pay | Admitting: *Deleted

## 2017-07-22 NOTE — Telephone Encounter (Signed)
Preadmission screen  

## 2017-07-23 ENCOUNTER — Other Ambulatory Visit: Payer: Self-pay | Admitting: Obstetrics

## 2017-07-27 ENCOUNTER — Inpatient Hospital Stay (HOSPITAL_COMMUNITY)
Admission: AD | Admit: 2017-07-27 | Discharge: 2017-07-29 | DRG: 807 | Disposition: A | Discharge status: Home / Self Care | Payer: 59 | Source: Ambulatory Visit | Attending: Obstetrics and Gynecology | Admitting: Obstetrics and Gynecology

## 2017-07-27 ENCOUNTER — Inpatient Hospital Stay (HOSPITAL_COMMUNITY): Payer: 59 | Admitting: Anesthesiology

## 2017-07-27 ENCOUNTER — Encounter (HOSPITAL_COMMUNITY): Payer: Self-pay | Admitting: *Deleted

## 2017-07-27 DIAGNOSIS — Z3490 Encounter for supervision of normal pregnancy, unspecified, unspecified trimester: Secondary | ICD-10-CM | POA: Diagnosis present

## 2017-07-27 DIAGNOSIS — E669 Obesity, unspecified: Secondary | ICD-10-CM | POA: Diagnosis present

## 2017-07-27 DIAGNOSIS — Z3A36 36 weeks gestation of pregnancy: Secondary | ICD-10-CM | POA: Diagnosis not present

## 2017-07-27 DIAGNOSIS — O99214 Obesity complicating childbirth: Secondary | ICD-10-CM | POA: Diagnosis present

## 2017-07-27 DIAGNOSIS — Z6833 Body mass index (BMI) 33.0-33.9, adult: Secondary | ICD-10-CM

## 2017-07-27 DIAGNOSIS — I10 Essential (primary) hypertension: Secondary | ICD-10-CM | POA: Diagnosis present

## 2017-07-27 DIAGNOSIS — O26893 Other specified pregnancy related conditions, third trimester: Secondary | ICD-10-CM | POA: Diagnosis present

## 2017-07-27 DIAGNOSIS — O1002 Pre-existing essential hypertension complicating childbirth: Secondary | ICD-10-CM | POA: Diagnosis present

## 2017-07-27 DIAGNOSIS — O139 Gestational [pregnancy-induced] hypertension without significant proteinuria, unspecified trimester: Secondary | ICD-10-CM | POA: Diagnosis present

## 2017-07-27 LAB — COMPREHENSIVE METABOLIC PANEL
ALT: 16 U/L (ref 14–54)
ANION GAP: 10 (ref 5–15)
AST: 21 U/L (ref 15–41)
Albumin: 3 g/dL — ABNORMAL LOW (ref 3.5–5.0)
Alkaline Phosphatase: 130 U/L — ABNORMAL HIGH (ref 38–126)
BUN: <5 mg/dL — ABNORMAL LOW (ref 6–20)
CHLORIDE: 107 mmol/L (ref 101–111)
CO2: 18 mmol/L — AB (ref 22–32)
Calcium: 8.8 mg/dL — ABNORMAL LOW (ref 8.9–10.3)
Creatinine, Ser: 0.41 mg/dL — ABNORMAL LOW (ref 0.44–1.00)
Glucose, Bld: 81 mg/dL (ref 65–99)
POTASSIUM: 2.9 mmol/L — AB (ref 3.5–5.1)
SODIUM: 135 mmol/L (ref 135–145)
Total Bilirubin: 1.1 mg/dL (ref 0.3–1.2)
Total Protein: 6.8 g/dL (ref 6.5–8.1)

## 2017-07-27 LAB — CBC
HCT: 34.9 % — ABNORMAL LOW (ref 36.0–46.0)
Hemoglobin: 12.6 g/dL (ref 12.0–15.0)
MCH: 32.2 pg (ref 26.0–34.0)
MCHC: 36.1 g/dL — ABNORMAL HIGH (ref 30.0–36.0)
MCV: 89.3 fL (ref 78.0–100.0)
PLATELETS: 211 10*3/uL (ref 150–400)
RBC: 3.91 MIL/uL (ref 3.87–5.11)
RDW: 14.5 % (ref 11.5–15.5)
WBC: 13 10*3/uL — AB (ref 4.0–10.5)

## 2017-07-27 LAB — TYPE AND SCREEN
ABO/RH(D): B POS
ANTIBODY SCREEN: NEGATIVE

## 2017-07-27 LAB — PROTEIN / CREATININE RATIO, URINE
CREATININE, URINE: 164 mg/dL
Protein Creatinine Ratio: 0.28 mg/mg{Cre} — ABNORMAL HIGH (ref 0.00–0.15)
TOTAL PROTEIN, URINE: 46 mg/dL

## 2017-07-27 LAB — URIC ACID: Uric Acid, Serum: 2.1 mg/dL — ABNORMAL LOW (ref 2.3–6.6)

## 2017-07-27 MED ORDER — EPHEDRINE 5 MG/ML INJ
10.0000 mg | INTRAVENOUS | Status: DC | PRN
  Filled 2017-07-27: qty 2

## 2017-07-27 MED ORDER — LACTATED RINGERS IV SOLN
500.0000 mL | Freq: Once | INTRAVENOUS | Status: AC
  Administered 2017-07-27: 500 mL via INTRAVENOUS

## 2017-07-27 MED ORDER — OXYTOCIN 40 UNITS IN LACTATED RINGERS INFUSION - SIMPLE MED
1.0000 m[IU]/min | INTRAVENOUS | Status: DC
  Administered 2017-07-28: 2 m[IU]/min via INTRAVENOUS
  Filled 2017-07-27: qty 1000

## 2017-07-27 MED ORDER — DIPHENHYDRAMINE HCL 50 MG/ML IJ SOLN
12.5000 mg | INTRAMUSCULAR | Status: DC | PRN
  Administered 2017-07-28: 12.5 mg via INTRAVENOUS
  Filled 2017-07-27: qty 1

## 2017-07-27 MED ORDER — PHENYLEPHRINE 40 MCG/ML (10ML) SYRINGE FOR IV PUSH (FOR BLOOD PRESSURE SUPPORT)
80.0000 ug | PREFILLED_SYRINGE | INTRAVENOUS | Status: DC | PRN
  Administered 2017-07-27: 80 ug via INTRAVENOUS
  Filled 2017-07-27: qty 5

## 2017-07-27 MED ORDER — LIDOCAINE HCL (PF) 1 % IJ SOLN
INTRAMUSCULAR | Status: DC | PRN
  Administered 2017-07-27: 8 mL via EPIDURAL
  Administered 2017-07-27: 4 mL via EPIDURAL

## 2017-07-27 MED ORDER — ACETAMINOPHEN 325 MG PO TABS
650.0000 mg | ORAL_TABLET | ORAL | Status: DC | PRN
  Filled 2017-07-27: qty 2

## 2017-07-27 MED ORDER — ONDANSETRON HCL 4 MG/2ML IJ SOLN
4.0000 mg | Freq: Four times a day (QID) | INTRAMUSCULAR | Status: DC | PRN
  Administered 2017-07-28 (×2): 4 mg via INTRAVENOUS
  Filled 2017-07-27 (×2): qty 2

## 2017-07-27 MED ORDER — TERBUTALINE SULFATE 1 MG/ML IJ SOLN
0.2500 mg | Freq: Once | INTRAMUSCULAR | Status: DC | PRN
  Filled 2017-07-27: qty 1

## 2017-07-27 MED ORDER — LACTATED RINGERS IV SOLN
INTRAVENOUS | Status: DC
  Administered 2017-07-27 – 2017-07-28 (×2): via INTRAVENOUS

## 2017-07-27 MED ORDER — EPHEDRINE 5 MG/ML INJ: 10.0000 mg | INTRAVENOUS | Status: DC | PRN

## 2017-07-27 MED ORDER — FENTANYL 2.5 MCG/ML BUPIVACAINE 1/10 % EPIDURAL INFUSION (WH - ANES): 14.0000 mL/h | INTRAMUSCULAR | Status: DC | PRN

## 2017-07-27 MED ORDER — PHENYLEPHRINE 40 MCG/ML (10ML) SYRINGE FOR IV PUSH (FOR BLOOD PRESSURE SUPPORT): 80.0000 ug | PREFILLED_SYRINGE | INTRAVENOUS | Status: DC | PRN

## 2017-07-27 MED ORDER — LACTATED RINGERS IV SOLN: 500.0000 mL | Freq: Once | INTRAVENOUS | Status: DC

## 2017-07-27 MED ORDER — SOD CITRATE-CITRIC ACID 500-334 MG/5ML PO SOLN: 30.0000 mL | ORAL | Status: DC | PRN

## 2017-07-27 MED ORDER — FENTANYL 2.5 MCG/ML BUPIVACAINE 1/10 % EPIDURAL INFUSION (WH - ANES)
14.0000 mL/h | INTRAMUSCULAR | Status: DC | PRN
  Administered 2017-07-27 – 2017-07-28 (×2): 14 mL/h via EPIDURAL
  Filled 2017-07-27 (×2): qty 100

## 2017-07-27 MED ORDER — PHENYLEPHRINE 40 MCG/ML (10ML) SYRINGE FOR IV PUSH (FOR BLOOD PRESSURE SUPPORT)
80.0000 ug | PREFILLED_SYRINGE | INTRAVENOUS | Status: DC | PRN
  Filled 2017-07-27: qty 5
  Filled 2017-07-27: qty 10

## 2017-07-27 MED ORDER — DIPHENHYDRAMINE HCL 50 MG/ML IJ SOLN: 12.5000 mg | INTRAMUSCULAR | Status: DC | PRN

## 2017-07-27 MED ORDER — MISOPROSTOL 25 MCG QUARTER TABLET
25.0000 ug | ORAL_TABLET | ORAL | Status: DC | PRN
  Filled 2017-07-27: qty 1

## 2017-07-27 NOTE — Anesthesia Pain Management Evaluation Note (Signed)
  CRNA Pain Management Visit Note  Patient: Meghan Calhoun, 26 y.o., female  "Hello I am a member of the anesthesia team at [facility]. We have an anesthesia team available at all times to provide care throughout the hospital, including epidural management and anesthesia for C-section. I don't know your plan for the delivery whether it a natural birth, water birth, IV sedation, nitrous supplementation, doula or epidural, but we want to meet your pain goals."   1.Was your pain managed to your expectations on prior hospitalizations?   Yes   2.What is your expectation for pain management during this hospitalization?     Epidural  3.How can we help you reach that goal? Pt recently had epidural placed and is comfortable.  Record the patient's initial score and the patient's pain goal.   Pain: 0  Pain Goal: 0 [facility] wants you to be able to say your pain was always managed very well.  Bessie Boyte 07/27/2017

## 2017-07-27 NOTE — Anesthesia Procedure Notes (Signed)
Epidural Patient location during procedure: OB Start time: 07/27/2017 6:15 PM End time: 07/27/2017 6:21 PM  Staffing Anesthesiologist: Leslye Peer E Performed: anesthesiologist   Preanesthetic Checklist Completed: patient identified, pre-op evaluation, timeout performed, IV checked, risks and benefits discussed and monitors and equipment checked  Epidural Patient position: sitting Prep: DuraPrep Patient monitoring: continuous pulse ox and blood pressure Approach: midline Location: L3-L4 Injection technique: LOR saline  Needle:  Needle type: Tuohy  Needle gauge: 17 G Needle length: 9 cm Needle insertion depth: 6 cm Catheter size: 19 Gauge Catheter at skin depth: 12 cm Test dose: negative and Other (1% lidocaine)  Additional Notes Patient identified. Risks including, but not limited to, bleeding, infection, nerve damage, paralysis, inadequate analgesia, blood pressure changes, nausea, vomiting, allergic reaction, postpartum back pain, itching, and headache were discussed. Patient expressed understanding and wished to proceed. Sterile prep and drape, including hand hygiene, mask, and sterile gloves were used. The patient was positioned and the spine was prepped. The skin was anesthetized with lidocaine. No paraesthesia or other complication noted. The patient did not experience any signs of intravascular injection such as tinnitus or metallic taste in mouth, nor signs of intrathecal spread such as rapid motor block. Please see nursing notes for vital signs. The patient tolerated the procedure well.   Leslye Peer, MDReason for block:procedure for pain

## 2017-07-27 NOTE — H&P (Signed)
Meghan Calhoun is a 26 y.o. female G2P1001 at 36.6wga  presenting for c/o ROM at 1400 today, clear fluid.  ROM confirmed at office this afternoon and sent to l/d for admission.  She has noted regular and painful contractions getting stronger since SROM.  She denies any vaginal bleeding and notes +FM. Antepartum course complicated with h/o renal disease, though in remission currently - labs and urine have been stable.  H/o pre-eclampsia with prior pregnancy.  She denies any current h/a, vision changes, ruq pain.  She does also have a h/o CHTN and has been controlled on labetalol  q am and mid-day,  po hs.  She has been followed with antenatal testing that has been reassuring and last growth u/s 2 wks ago with efw 88% (6lb7oz), afi 19cm.   [facility] at [facility] since 10 wks History OB History    Gravida Para Term Preterm AB Living   0 0 1   SAB TAB Ectopic Multiple Live Births   0 0 0 0 1     Past Medical History:  Diagnosis Date  . Chronic kidney disease   . GERD (gastroesophageal reflux disease)   . Glomerulonephritis   . Hx of varicella   . Hypertension    During pregnancy  . Medical history non-contributory   . Pregnancy induced hypertension    Past Surgical History:  Procedure Laterality Date  . ELBOW SURGERY    . MOUTH SURGERY     Family History: family history includes Anxiety disorder in her sister; Hypertension in her mother. Social History:  reports that she has never smoked. She has never used smokeless tobacco. She reports that she does not drink alcohol or use drugs.  ROS  See above, otherwise negative   Exam Physical Exam  Prenatal labs: ABO, Rh: --/--/B POS (10/01 1726) Antibody: NEG (10/01 1726) Rubella: Immune (03/28 0000) RPR: Nonreactive (03/28 0000)  HBsAg: Negative (03/28 0000)  HIV: Non-reactive (03/28 0000)  GBS: Negative (09/18 0000)  1 hr Glucola - 101 Genetic screening - quad negative Anatomy US: normal  Physical Exam:  A&O x  3 HEENT - wnl Lungs - ctab CV  - rrr Abdo - soft, nt, gravid; efw 7lbs Extr : trace edema, nt bilat LE Pelvic : 4/9-/-2, small amount clear fluid; ?forebag, amniohook used but no further fluid noted  FHT: 120s, normal variability, +accels, 3 decels to 110 and lasting <1 min (appear early and 1 spontaneous) - resolved and +accels immed after Toco: irreg: q 2-3 min  Assessment/Plan:  iup at 36.6wga 1. SROM - admit for labor, pt is now s/p epidural; plan svd 2. chtn - stable, labs wnl 3. Chronic renal disease, in remission and has been stable 4. Fetal status reassuring 5. gbs negative   [name] 07/27/2017, 6:59 PM

## 2017-07-27 NOTE — Anesthesia Preprocedure Evaluation (Signed)
Anesthesia Evaluation  Patient identified by MRN, date of birth, ID band Patient awake    Reviewed: Allergy & Precautions, NPO status , Patient's Chart, lab work & pertinent test results  History of Anesthesia Complications Negative for: history of anesthetic complications  Airway Mallampati: II  TM Distance: >3 FB Neck ROM: Full    Dental no notable dental hx. (+) Dental Advisory Given   Pulmonary neg pulmonary ROS,    Pulmonary exam normal breath sounds clear to auscultation       Cardiovascular hypertension, Normal cardiovascular exam Rhythm:Regular Rate:Normal     Neuro/Psych negative neurological ROS  negative psych ROS   GI/Hepatic Neg liver ROS, GERD  ,  Endo/Other  obesity  Renal/GU Renal InsufficiencyRenal disease  negative genitourinary   Musculoskeletal negative musculoskeletal ROS (+)   Abdominal   Peds negative pediatric ROS (+)  Hematology negative hematology ROS (+)   Anesthesia Other Findings   Reproductive/Obstetrics (+) Pregnancy Pre-eclampsia                             Anesthesia Physical  Anesthesia Plan  ASA: II  Anesthesia Plan: Epidural   Post-op Pain Management:    Induction:   PONV Risk Score and Plan: Treatment may vary due to age or medical condition  Airway Management Planned: Natural Airway  Additional Equipment: None  Intra-op Plan:   Post-operative Plan:   Informed Consent: I have reviewed the patients History and Physical, chart, labs and discussed the procedure including the risks, benefits and alternatives for the proposed anesthesia with the patient or authorized representative who has indicated his/her understanding and acceptance.   Dental advisory given  Plan Discussed with:   Anesthesia Plan Comments:         Anesthesia Quick Evaluation

## 2017-07-28 ENCOUNTER — Inpatient Hospital Stay (HOSPITAL_COMMUNITY): Admission: RE | Admit: 2017-07-28 | Payer: 59 | Source: Ambulatory Visit

## 2017-07-28 ENCOUNTER — Encounter (HOSPITAL_COMMUNITY): Payer: Self-pay | Admitting: General Practice

## 2017-07-28 LAB — RPR: RPR Ser Ql: NONREACTIVE

## 2017-07-28 MED ORDER — ONDANSETRON HCL 4 MG/2ML IJ SOLN: 4.0000 mg | INTRAMUSCULAR | Status: DC | PRN

## 2017-07-28 MED ORDER — TETANUS-DIPHTH-ACELL PERTUSSIS 5-2.5-18.5 LF-MCG/0.5 IM SUSP: 0.5000 mL | Freq: Once | INTRAMUSCULAR | Status: DC

## 2017-07-28 MED ORDER — SIMETHICONE 80 MG PO CHEW: 80.0000 mg | CHEWABLE_TABLET | ORAL | Status: DC | PRN

## 2017-07-28 MED ORDER — IBUPROFEN 600 MG PO TABS
600.0000 mg | ORAL_TABLET | Freq: Four times a day (QID) | ORAL | Status: DC
  Administered 2017-07-28 – 2017-07-29 (×4): 600 mg via ORAL
  Filled 2017-07-28 (×5): qty 1

## 2017-07-28 MED ORDER — WITCH HAZEL-GLYCERIN EX PADS: 1.0000 "application " | MEDICATED_PAD | CUTANEOUS | Status: DC | PRN

## 2017-07-28 MED ORDER — COCONUT OIL OIL: 1.0000 "application " | TOPICAL_OIL | Status: DC | PRN

## 2017-07-28 MED ORDER — OXYTOCIN 40 UNITS IN LACTATED RINGERS INFUSION - SIMPLE MED
2.5000 [IU]/h | INTRAVENOUS | Status: DC
Start: 2017-07-28 — End: 2017-07-28

## 2017-07-28 MED ORDER — LIDOCAINE HCL (PF) 1 % IJ SOLN
INTRAMUSCULAR | Status: AC
  Filled 2017-07-28: qty 30

## 2017-07-28 MED ORDER — DIPHENHYDRAMINE HCL 25 MG PO CAPS
25.0000 mg | ORAL_CAPSULE | Freq: Four times a day (QID) | ORAL | Status: DC | PRN
  Administered 2017-07-28: 25 mg via ORAL
  Filled 2017-07-28: qty 1

## 2017-07-28 MED ORDER — PRENATAL MULTIVITAMIN CH
1.0000 | ORAL_TABLET | Freq: Every day | ORAL | Status: DC
  Administered 2017-07-28 – 2017-07-29 (×2): 1 via ORAL
  Filled 2017-07-28 (×2): qty 1

## 2017-07-28 MED ORDER — ACETAMINOPHEN 325 MG PO TABS
650.0000 mg | ORAL_TABLET | ORAL | Status: DC | PRN
  Administered 2017-07-29: 650 mg via ORAL
  Filled 2017-07-28: qty 2

## 2017-07-28 MED ORDER — ACETAMINOPHEN 325 MG PO TABS
650.0000 mg | ORAL_TABLET | ORAL | Status: DC | PRN
  Administered 2017-07-28: 650 mg via ORAL

## 2017-07-28 MED ORDER — DIBUCAINE 1 % RE OINT
1.0000 | TOPICAL_OINTMENT | RECTAL | Status: DC | PRN
Start: 2017-07-28 — End: 2017-07-29

## 2017-07-28 MED ORDER — SENNOSIDES-DOCUSATE SODIUM 8.6-50 MG PO TABS
2.0000 | ORAL_TABLET | ORAL | Status: DC
  Administered 2017-07-28: 2 via ORAL
  Filled 2017-07-28: qty 2

## 2017-07-28 MED ORDER — BENZOCAINE-MENTHOL 20-0.5 % EX AERO
1.0000 "application " | INHALATION_SPRAY | CUTANEOUS | Status: DC | PRN
  Administered 2017-07-28: 1 via TOPICAL
  Filled 2017-07-28: qty 56

## 2017-07-28 MED ORDER — OXYTOCIN BOLUS FROM INFUSION
500.0000 mL | Freq: Once | INTRAVENOUS | Status: AC
  Administered 2017-07-28: 500 mL via INTRAVENOUS

## 2017-07-28 MED ORDER — ONDANSETRON HCL 4 MG PO TABS: 4.0000 mg | ORAL_TABLET | ORAL | Status: DC | PRN

## 2017-07-28 MED ORDER — LACTATED RINGERS IV SOLN: 500.0000 mL | INTRAVENOUS | Status: DC | PRN

## 2017-07-28 NOTE — Progress Notes (Signed)
Comfortable with epidural, occ feels pressure  Patient Vitals for the past 24 hrs:  BP Temp Temp src Pulse Resp Height Weight  07/28/17 0600 133/78 - - 82 - - -  07/28/17 0530 127/74 - - 89 - - -  07/28/17 0500 129/80 99.4 F (37.4 C) Oral 92 18 - -  07/28/17 0430 119/68 - - (!) 144 - - -  07/28/17 0400 125/73 - - 98 - - -  07/28/17 0330 121/71 - - (!) 104 18 - -  07/28/17 0300 119/68 99.1 F (37.3 C) Oral (!) 110 - - -  07/28/17 0230 116/71 - - (!) 118 16 - -  07/28/17 0200 115/64 - - 93 - - -  07/28/17 0130 111/67 - - 99 - - -  07/28/17 0100 (!) 99/45 98.4 F (36.9 C) Oral 99 18 - -  07/28/17 0030 (!) 99/58 - - (!) 105 - - -  07/28/17 0000 (!) 106/50 - - 96 - - -  07/27/17 2330 118/64 - - (!) 101 - - -  07/27/17 2300 115/74 98 F (36.7 C) Oral (!) 110 16 - -  07/27/17 2230 130/82 - - 86 - - -  07/27/17 2200 120/62 - - 79 - - -  07/27/17 2130 128/75 - - 89 - - -  07/27/17 2100 127/81 98.3 F (36.8 C) Oral (!) 110 18 - -  07/27/17 2030 120/71 - - 90 - - -  07/27/17 2000 120/69 - - 85 - - -  07/27/17 1930 125/73 - - 100 17 - -  07/27/17 1901 (!) 130/109 98 F (36.7 C) Oral 78 18 - -  07/27/17 1855 128/79 - - (!) 106 - - -  07/27/17 1852 125/68 - - (!) 126 - - -  07/27/17 1845 126/77 - - (!) 102 - - -  07/27/17 1844 121/77 - - (!) 111 - - -  07/27/17 1841 (!) 110/53 - - (!) 114 - - -  07/27/17 1839 114/78 - - (!) 102 - - -  07/27/17 1835 123/76 - - (!) 126 - - -  07/27/17 1830 132/75 - - (!) 137 - - -  07/27/17 1827 131/66 - - (!) 138 18 - -  07/27/17 1822 (!) 153/93 - - (!) 113 18 - -  07/27/17 1821 (!) 148/94 - - (!) 109 - - -  07/27/17 1813 (!) 150/86 98 F (36.7 C) Oral (!) 110 18 - -  07/27/17 1730 - - - - -  (1.651 m) 203 lb (92.1 kg)  07/27/17 1724 (!) 135/92 99.6 F (37.6 C) Oral (!) 119 20 - -    I/o: about in   A&0x3 nml respirations Abd: soft, nt, gravid; efw 7lbs Cx: 8/80/-1, cephalic; IUPC placed w/o difficulty LE: no edema, nt bilat  LE  FHT: 120s, normal variability, +accels, no decels Toco: irregular contractions, now more q 2  CBC    Component Value Date/Time   WBC 13.0 (H) 07/27/2017 1726   RBC 3.91 07/27/2017 1726   HGB 12.6 07/27/2017 1726   HCT 34.9 (L) 07/27/2017 1726   PLT 211 07/27/2017 1726   MCV 89.3 07/27/2017 1726   MCH 32.2 07/27/2017 1726   MCHC 36.1 (H) 07/27/2017 1726   RDW 14.5 07/27/2017 1726   LYMPHSABS 1.9 01/31/2016 0814   MONOABS 0.7 01/31/2016 0814   EOSABS 0.1 01/31/2016 0814   BASOSABS 0.0 01/31/2016 0814   CMP Latest Ref Rng & Units 07/27/2017 01/31/2016 08/21/2015  Glucose 65 - 99 mg/dL 81 132(G) 89  BUN 6 - 20 mg/dL <4(W) <1(U) 8  Creatinine 0.44 - 1.00 mg/dL 2.72(Z) 3.66 4.40  Sodium 135 - 145 mmol/L 135 139 137  Potassium 3.5 - 5.1 mmol/L 2.9(L) 3.8 3.2(L)  Chloride 101 - 111 mmol/L 107 104 108  CO2 22 - 32 mmol/L 18(L) 26 26  Calcium 8.9 - 10.3 mg/dL 3.4(V) 7.6(L) 7.5(L)  Total Protein 6.5 - 8.1 g/dL 6.8 4.2(V) 4.6(L)  Total Bilirubin 0.3 - 1.2 mg/dL 1.1 0.3 0.4  Alkaline Phos 38 - 126 U/L 130(H) 61 80  AST 15 - 41 U/L ALT 14 - 54 U/L A/p: iup at 37.0 1. SROM - continue pitocin augmentation, now at 12 mU/min; now with good cervical change, follow closely; contin peanut ball and position changed; plan svd 2. Fetal status reassuring 3. Chronic renal dz, stable; good uOP 4. CHTN: stable, follow closely 5. gbs negative 6. Rubella immune, rh pos

## 2017-07-28 NOTE — Progress Notes (Signed)
CSW received consult for hx of Anxiety.  CSW completed thorough chart review and notes no dx of Anxiety documented for patient, however, a family hx of Anxiety is mentioned.  Please contact CSW if concerns arise, by MOB's request, or if MOB scores greater than 9/yes to question 10 on Edinburgh Postnatal Depression Scale.

## 2017-07-28 NOTE — Anesthesia Postprocedure Evaluation (Signed)
Anesthesia Post Note  Patient: Meghan Calhoun  Procedure(s) Performed: AN AD HOC LABOR EPIDURAL     Patient location during evaluation: Mother Baby Anesthesia Type: Epidural Level of consciousness: awake, awake and alert and oriented Pain management: pain level controlled Vital Signs Assessment: post-procedure vital signs reviewed and stable Respiratory status: spontaneous breathing, nonlabored ventilation and respiratory function stable Cardiovascular status: stable Postop Assessment: no headache, no backache, patient able to bend at knees, no apparent nausea or vomiting and adequate PO intake Anesthetic complications: no    Last Vitals:  Vitals:   07/28/17 1028 07/28/17 1517  BP: 128/68 126/79  Pulse: 74 79  Resp: 18 20  Temp: 37.2 C 37.3 C    Last Pain:  Vitals:   07/28/17 1517  TempSrc: Oral  PainSc:    Pain Goal:                 Sami Froh

## 2017-07-29 DIAGNOSIS — I10 Essential (primary) hypertension: Secondary | ICD-10-CM | POA: Diagnosis present

## 2017-07-29 DIAGNOSIS — O139 Gestational [pregnancy-induced] hypertension without significant proteinuria, unspecified trimester: Secondary | ICD-10-CM | POA: Diagnosis present

## 2017-07-29 LAB — CBC
HCT: 33.3 % — ABNORMAL LOW (ref 36.0–46.0)
Hemoglobin: 11.8 g/dL — ABNORMAL LOW (ref 12.0–15.0)
MCH: 32.2 pg (ref 26.0–34.0)
MCHC: 35.4 g/dL (ref 30.0–36.0)
MCV: 90.7 fL (ref 78.0–100.0)
PLATELETS: 192 10*3/uL (ref 150–400)
RBC: 3.67 MIL/uL — AB (ref 3.87–5.11)
RDW: 14.8 % (ref 11.5–15.5)
WBC: 11.2 10*3/uL — AB (ref 4.0–10.5)

## 2017-07-29 LAB — BIRTH TISSUE RECOVERY COLLECTION (PLACENTA DONATION)

## 2017-07-29 MED ORDER — LABETALOL HCL 100 MG PO TABS: 100.0000 mg | ORAL_TABLET | Freq: Three times a day (TID) | ORAL | Status: DC

## 2017-07-29 MED ORDER — IBUPROFEN 600 MG PO TABS: 600.0000 mg | ORAL_TABLET | Freq: Four times a day (QID) | ORAL | 0 refills | Status: DC

## 2017-07-29 NOTE — Discharge Summary (Signed)
Obstetric Discharge Summary Reason for Admission: onset of labor Prenatal Procedures: none Intrapartum Procedures: spontaneous vaginal delivery Postpartum Procedures: none Complications-Operative and Postpartum: 1st degree perineal laceration Hemoglobin  Date Value Ref Range Status  07/29/2017 11.8 (L) 12.0 - 15.0 g/dL Final   HCT  Date Value Ref Range Status  07/29/2017 33.3 (L) 36.0 - 46.0 % Final    Physical Exam:  General: alert, cooperative and no distress Lochia: appropriate Uterine Fundus: firm Incision: healing well DVT Evaluation: No evidence of DVT seen on physical exam.  Discharge Diagnoses: Term Pregnancy-delivered  Discharge Information: Date: 07/29/2017 Activity: pelvic rest Diet: routine Medications: PNV, Ibuprofen and labetalol Condition: stable Instructions: refer to practice specific booklet Discharge to: home Follow-up Information    [name], MD. Schedule an appointment as soon as possible for a visit in 1 week(s).   Specialty:  Obstetrics and Gynecology Contact information: [address] [place] [address] [phone]           Newborn Data: Live born female  Birth Weight: 7 lb 7.6 oz (3390 g) APGAR: 8, 9  Newborn Delivery   Birth date/time:  07/28/2017 07:52:00 Delivery type:  Vaginal, Spontaneous Delivery      Home with mother.  Meghan Calhoun 07/29/2017, 1:53 PM

## 2017-07-29 NOTE — Plan of Care (Signed)
Problem: Role Relationship: Goal: Ability to demonstrate positive interaction with newborn will improve Outcome: Completed/Met Date Met: 07/29/17 Patient is bonding well with newborn.

## 2017-07-29 NOTE — Progress Notes (Signed)
PPD 1 SVD with 1st degree / renal disease with chronic HTN & gestational hypertension with medication  S:  Reports feeling Ok - tired and sore with mild headache / no vision changes or epigastric pain             Tolerating po/ No nausea or vomiting             Bleeding is moderate             Pain controlled with motrin             Up ad lib / ambulatory / voiding QS  Newborn formula feeding   O:   VS: BP 137/83 (BP Location: Right Leg)   Pulse 72   Temp 97.9 F (36.6 C) (Oral)   Resp 18   Ht  (1.651 m)   Wt 92.1 kg (203 lb)   SpO2 100%   Breastfeeding? Unknown   BMI 33.78 kg/m                 BP: 137/83 - 143/82 - 127/74   LABS:  10/1 CMP with normal LE and creatinine or 0.4             Recent Labs  07/27/17 1726 07/29/17 0527  WBC 13.0* 11.2*  HGB 12.6 11.8*  PLT 211 192               Blood type: --/--/B POS (10/01 1726)  Rubella: Immune                 I&O: Intake/Output      10/02 0701 - 10/03 0700 10/03 0701 - 10/04 0700   Urine (mL/kg/hr) 1300 (0.6)    Blood 200    Total Output 1500     Net -1500          Urine Occurrence 2 x                 Physical Exam:             Alert and oriented X3  Abdomen: soft, non-tender, non-distended              Fundus: firm, non-tender, Ueven  Perineum: ice pack in place  Lochia: light  Extremities: no edema, no calf pain or tenderness  A: PPD # 1 SVD with 1st degree repair             Renal dz (glomerulonephropathy) and chronic HTN              Gestational hypertension on Labetalol 100 TID  Doing well - stable status  P: Routine post partum orders  Continue labetalol 100 TID / monitor BP & I&O             Offer flu vaccine             Anticipate DC tomorrow  Marlinda Mike CNM, MSN, FACNM 07/29/2017, 8:16 AM

## 2017-07-29 NOTE — Addendum Note (Signed)
Addendum  created 07/29/17 1834 by Phillips Grout, MD   Anesthesia Staff edited

## 2017-07-30 ENCOUNTER — Inpatient Hospital Stay (HOSPITAL_COMMUNITY)
Admission: AD | Admit: 2017-07-30 | Discharge: 2017-07-30 | Disposition: A | Discharge status: Home / Self Care | Payer: 59 | Source: Ambulatory Visit | Attending: Obstetrics and Gynecology | Admitting: Obstetrics and Gynecology

## 2017-07-30 ENCOUNTER — Encounter (HOSPITAL_COMMUNITY): Payer: Self-pay | Admitting: Student

## 2017-07-30 ENCOUNTER — Inpatient Hospital Stay (HOSPITAL_COMMUNITY): Payer: 59

## 2017-07-30 DIAGNOSIS — O9089 Other complications of the puerperium, not elsewhere classified: Secondary | ICD-10-CM | POA: Insufficient documentation

## 2017-07-30 DIAGNOSIS — R110 Nausea: Secondary | ICD-10-CM | POA: Diagnosis not present

## 2017-07-30 DIAGNOSIS — R1030 Lower abdominal pain, unspecified: Secondary | ICD-10-CM | POA: Diagnosis not present

## 2017-07-30 DIAGNOSIS — R109 Unspecified abdominal pain: Secondary | ICD-10-CM | POA: Diagnosis present

## 2017-07-30 DIAGNOSIS — R52 Pain, unspecified: Secondary | ICD-10-CM

## 2017-07-30 LAB — COMPREHENSIVE METABOLIC PANEL
ALBUMIN: 2.5 g/dL — AB (ref 3.5–5.0)
ALT: 18 U/L (ref 14–54)
AST: 22 U/L (ref 15–41)
Alkaline Phosphatase: 109 U/L (ref 38–126)
Anion gap: 11 (ref 5–15)
BUN: 7 mg/dL (ref 6–20)
CHLORIDE: 109 mmol/L (ref 101–111)
CO2: 21 mmol/L — AB (ref 22–32)
Calcium: 8.5 mg/dL — ABNORMAL LOW (ref 8.9–10.3)
Creatinine, Ser: 0.37 mg/dL — ABNORMAL LOW (ref 0.44–1.00)
GFR calc Af Amer: >60 mL/min (ref >60)
GFR calc non Af Amer: >60 mL/min (ref >60)
GLUCOSE: 95 mg/dL (ref 65–99)
POTASSIUM: 3.1 mmol/L — AB (ref 3.5–5.1)
SODIUM: 141 mmol/L (ref 135–145)
Total Bilirubin: 0.4 mg/dL (ref 0.3–1.2)
Total Protein: 6.2 g/dL — ABNORMAL LOW (ref 6.5–8.1)

## 2017-07-30 LAB — CBC
HCT: 34.5 % — ABNORMAL LOW (ref 36.0–46.0)
Hemoglobin: 12.3 g/dL (ref 12.0–15.0)
MCH: 31.9 pg (ref 26.0–34.0)
MCHC: 35.7 g/dL (ref 30.0–36.0)
MCV: 89.6 fL (ref 78.0–100.0)
PLATELETS: 214 10*3/uL (ref 150–400)
RBC: 3.85 MIL/uL — ABNORMAL LOW (ref 3.87–5.11)
RDW: 14.5 % (ref 11.5–15.5)
WBC: 12.5 10*3/uL — AB (ref 4.0–10.5)

## 2017-07-30 LAB — URINALYSIS, ROUTINE W REFLEX MICROSCOPIC
BILIRUBIN URINE: NEGATIVE
GLUCOSE, UA: NEGATIVE mg/dL
KETONES UR: NEGATIVE mg/dL
NITRITE: NEGATIVE
PH: 6 (ref 5.0–8.0)
Protein, ur: 100 mg/dL — AB
Specific Gravity, Urine: 1.017 (ref 1.005–1.030)

## 2017-07-30 MED ORDER — PROMETHAZINE HCL 25 MG/ML IJ SOLN
12.5000 mg | Freq: Once | INTRAMUSCULAR | Status: AC
  Administered 2017-07-30: 12.5 mg via INTRAVENOUS

## 2017-07-30 MED ORDER — LACTATED RINGERS IV BOLUS (SEPSIS)
1000.0000 mL | Freq: Once | INTRAVENOUS | Status: AC
  Administered 2017-07-30: 1000 mL via INTRAVENOUS

## 2017-07-30 MED ORDER — PROMETHAZINE HCL 25 MG/ML IJ SOLN
25.0000 mg | Freq: Once | INTRAMUSCULAR | Status: DC
  Filled 2017-07-30: qty 1

## 2017-07-30 MED ORDER — HYDROMORPHONE HCL 1 MG/ML IJ SOLN
1.0000 mg | Freq: Once | INTRAMUSCULAR | Status: AC
  Administered 2017-07-30: 1 mg via INTRAVENOUS
  Filled 2017-07-30: qty 1

## 2017-07-30 NOTE — MAU Note (Signed)
Pt states she was discharged from hospital today after a NSVD on Tuesday. States everything was fine and then several hours ago pt started having sharp, left sided pain that radiates into back. Pt states she is nauseous, but has not had any vomiting. Pt denies fever, urinary s/s. Pt states bleeding is minimal.

## 2017-07-30 NOTE — MAU Provider Note (Signed)
History     CSN: [id number]  Arrival date and time: 07/30/17 0344  First Provider Initiated Contact with Patient 07/30/17 0408      Chief Complaint  Patient presents with  . Abdominal Pain   HPI Meghan Calhoun is a 26 y.o. G44P2002 female who presents with abdominal pain. She is 2 days s/p SVD. Denies complications with delivery. Describes sudden onset lower abdominal pain that radiates to low back; started at 1 am. Pain is intermittent. Rates pain 6/10. Has not treated pain. Endorses nausea; no vomiting. Denies fever/chills, vaginal bleeding, malodorous discharge, dysuria, diarrhea, or constipation. Last BM was this morning and normal for her. States her lochia is brown and minimal.   OB History    Gravida Para Term Preterm AB Living   0 0 2   SAB TAB Ectopic Multiple Live Births   0 0 0 0 2      Past Medical History:  Diagnosis Date  . Chronic kidney disease   . GERD (gastroesophageal reflux disease)   . Glomerulonephritis   . Hx of varicella   . Hypertension    During pregnancy  . Pregnancy induced hypertension     Past Surgical History:  Procedure Laterality Date  . ELBOW SURGERY    . MOUTH SURGERY      Family History  Problem Relation Age of Onset  . Hypertension Mother   . Anxiety disorder Sister     Social History  Substance Use Topics  . Smoking status: Never Smoker  . Smokeless tobacco: Never Used  . Alcohol use No     Comment: occas    Allergies: No Known Allergies  Prescriptions Prior to Admission  Medication Sig Dispense Refill Last Dose  . ibuprofen (ADVIL,MOTRIN) 600 MG tablet Take 1 tablet (600 mg total) by mouth every 6 (six) hours. 30 tablet 0 07/30/2017 at 0000  . labetalol (NORMODYNE) 100 MG tablet Take 100-200 mg by mouth 3 (three) times daily.  in the morning,  at lunch,  at bedtime   07/29/2017 at 2200  . pantoprazole (PROTONIX) 40 MG tablet Take 40 mg by mouth daily.   07/27/2017 at Unknown time    Review of Systems   Constitutional: Negative for chills and fever.  Gastrointestinal: Positive for abdominal pain and nausea. Negative for constipation, diarrhea and vomiting.  Genitourinary: Positive for vaginal discharge. Negative for dysuria, flank pain, frequency and vaginal bleeding.  Musculoskeletal: Positive for back pain.   Physical Exam   Blood pressure 124/64, pulse 77, temperature 98.3 F (36.8 C), temperature source Oral, resp. rate 18, height  (1.651 m), weight 195 lb (88.5 kg), SpO2 100 %, unknown if currently breastfeeding.  Physical Exam  Nursing note and vitals reviewed. Constitutional: She is oriented to person, place, and time. She appears well-developed and well-nourished. No distress.  HENT:  Head: Normocephalic and atraumatic.  Eyes: Conjunctivae are normal. Right eye exhibits no discharge. Left eye exhibits no discharge. No scleral icterus.  Neck: Normal range of motion.  Cardiovascular: Normal rate, regular rhythm and normal heart sounds.   No murmur heard. Respiratory: Effort normal and breath sounds normal. No respiratory distress. She has no wheezes.  GI: Soft. Bowel sounds are normal. There is no tenderness. There is no rigidity, no rebound, no guarding and no CVA tenderness.  Neurological: She is alert and oriented to person, place, and time.  Skin: Skin is warm and dry. She is not diaphoretic.  Psychiatric: She has a normal mood and  affect. Her behavior is normal. Judgment and thought content normal.    MAU Course  Procedures Results for orders placed or performed during the hospital encounter of 07/30/17 (from the past 24 hour(s))  Urinalysis, Routine w reflex microscopic     Status: Abnormal   Collection Time: 07/30/17  3:52 AM  Result Value Ref Range   Color, Urine YELLOW YELLOW   APPearance HAZY (A) CLEAR   Specific Gravity, Urine 1.017 1.005 - 1.030   pH 6.0 5.0 - 8.0   Glucose, UA NEGATIVE NEGATIVE mg/dL   Hgb urine dipstick LARGE (A) NEGATIVE   Bilirubin  Urine NEGATIVE NEGATIVE   Ketones, ur NEGATIVE NEGATIVE mg/dL   Protein, ur 161 (A) NEGATIVE mg/dL   Nitrite NEGATIVE NEGATIVE   Leukocytes, UA LARGE (A) NEGATIVE   RBC / HPF TOO NUMEROUS TO COUNT 0 - 5 RBC/hpf   WBC, UA TOO NUMEROUS TO COUNT 0 - 5 WBC/hpf   Bacteria, UA RARE (A) NONE SEEN   Squamous Epithelial / LPF 0-5 (A) NONE SEEN   Mucus PRESENT   CBC     Status: Abnormal   Collection Time: 07/30/17  4:21 AM  Result Value Ref Range   WBC 12.5 (H) 4.0 - 10.5 K/uL   RBC 3.85 (L) 3.87 - 5.11 MIL/uL   Hemoglobin 12.3 12.0 - 15.0 g/dL   HCT 09.6 (L) 04.5 - 40.9 %   MCV 89.6 78.0 - 100.0 fL   MCH 31.9 26.0 - 34.0 pg   MCHC 35.7 30.0 - 36.0 g/dL   RDW 81.1 91.4 - 78.2 %   Platelets 214 150 - 400 K/uL  Comprehensive metabolic panel     Status: Abnormal   Collection Time: 07/30/17  4:21 AM  Result Value Ref Range   Sodium 141 135 - 145 mmol/L   Potassium 3.1 (L) 3.5 - 5.1 mmol/L   Chloride 109 101 - 111 mmol/L   CO2 21 (L) 22 - 32 mmol/L   Glucose, Bld 95 65 - 99 mg/dL   BUN 7 6 - 20 mg/dL   Creatinine, Ser 9.56 (L) 0.44 - 1.00 mg/dL   Calcium 8.5 (L) 8.9 - 10.3 mg/dL   Total Protein 6.2 (L) 6.5 - 8.1 g/dL   Albumin 2.5 (L) 3.5 - 5.0 g/dL   AST 22 15 - 41 U/L   ALT 18 14 - 54 U/L   Alkaline Phosphatase 109 38 - 126 U/L   Total Bilirubin 0.4 0.3 - 1.2 mg/dL   GFR calc non Af Amer >60 >60 mL/min   GFR calc Af Amer >60 >60 mL/min   Anion gap 11 5 - 15   US Pelvis (transabdominal Only)  Result Date: 07/30/2017 CLINICAL DATA:  Acute severe lower abdominal and back pain. Status post vaginal delivery July 28, 2017. EXAM: TRANSABDOMINAL ULTRASOUND OF PELVIS TECHNIQUE: Transabdominal ultrasound examination of the pelvis was performed including evaluation of the uterus, ovaries, adnexal regions, and pelvic cul-de-sac. COMPARISON:  None. FINDINGS: Uterus Measurements: 15.2 x 10.2 x 11.9 cm. No fibroids or other mass visualized. Endometrium Thickness: 19 mm.  Echogenic avascular  contents. Right ovary Measurements: 3.9 x 2 x 3 x 1.9 cm. Normal appearance/no adnexal mass. Left ovary Not sonographically identified. Other findings:  No abnormal free fluid. IMPRESSION: 1. Recent postpartum uterus. Blood products within the endometrium, less likely retained products of conception. Electronically Signed   By: Awilda Metro M.D.   On: 07/30/2017 05:17    MDM Patient writhing in bed during my exam.  Initial BP elevated, likely d/t pain & moving in bed. Repeat BPs WNL. Pt on labetalol & has f/u for BP check next week; no h/a, visual disturbance, or epigastric pain.  Dilaudid 1 mg IV & phenergan 12.5 mg IV given --- patient reports no further abdominal pain since initial exam. Patient has passed flatus since then; high suspicion for gas pain.  Ultrasound normal for PP period C/w Dr. Billy Coast. Ok to discharge home  Assessment and Plan  A: 1. Nausea   2. Postpartum pain    P: Discharge home F/u with ob as scheduled Discussed reasons to return to MAU  Judeth Horn 07/30/2017, 4:07 AM

## 2017-07-30 NOTE — Discharge Instructions (Signed)

## 2017-10-22 IMAGING — US US RENAL
1 series · 14 of 25 positions shown · non-contrast
Comparison: None.

CLINICAL DATA: Protein urea, postpartum 5 months

EXAM:
RENAL / URINARY TRACT ULTRASOUND COMPLETE

[Series 1: us renal · 0.26mm/px · 14 of 36 slices shown]
[im 1/36]
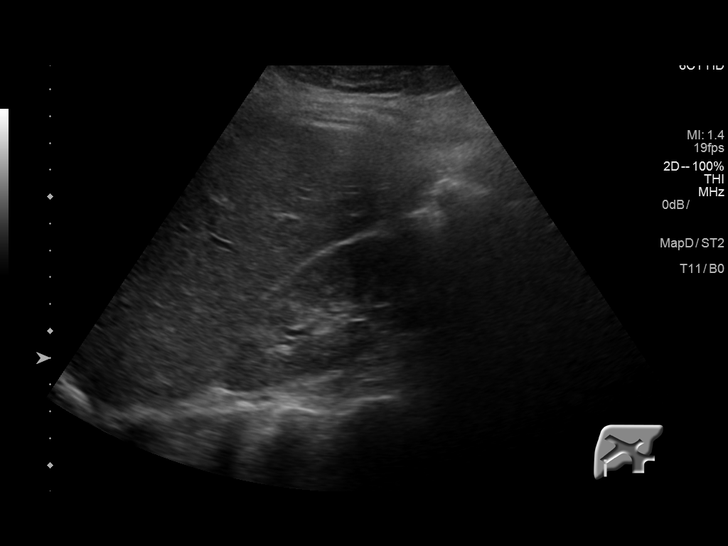
[im 3/36]
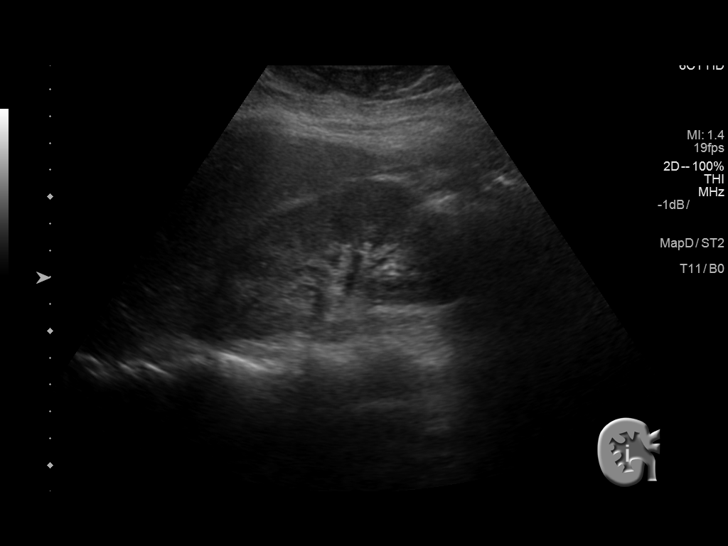
[im 6/36]
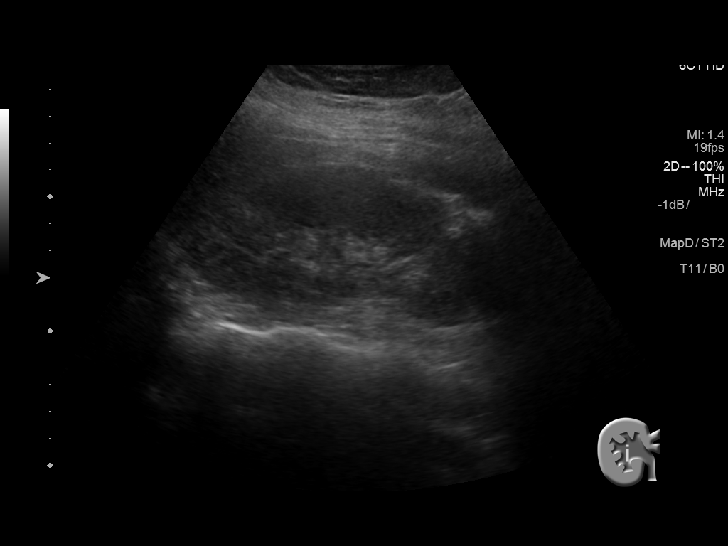
[im 9/36]
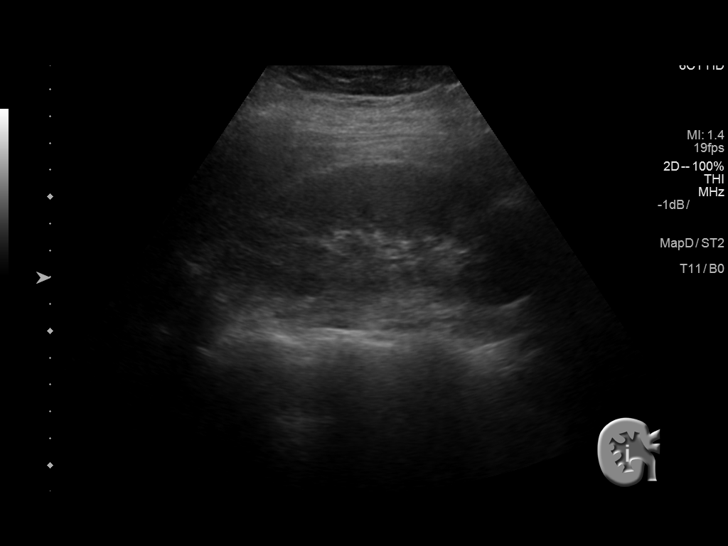
[im 12/36]
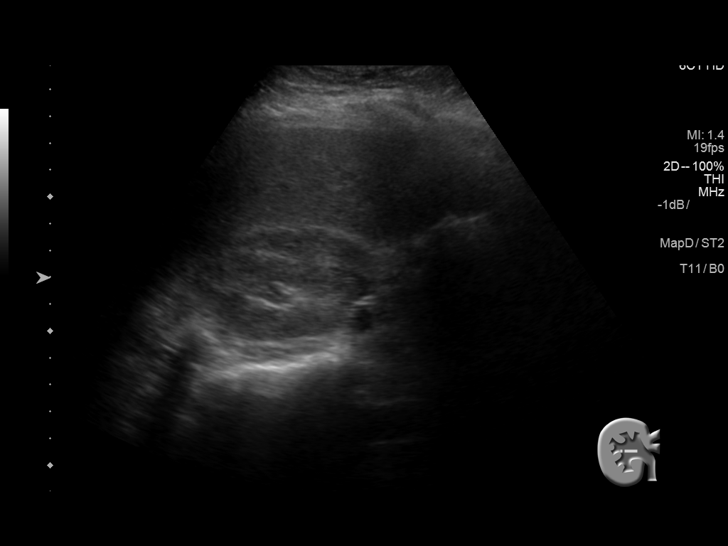
[im 14/36]
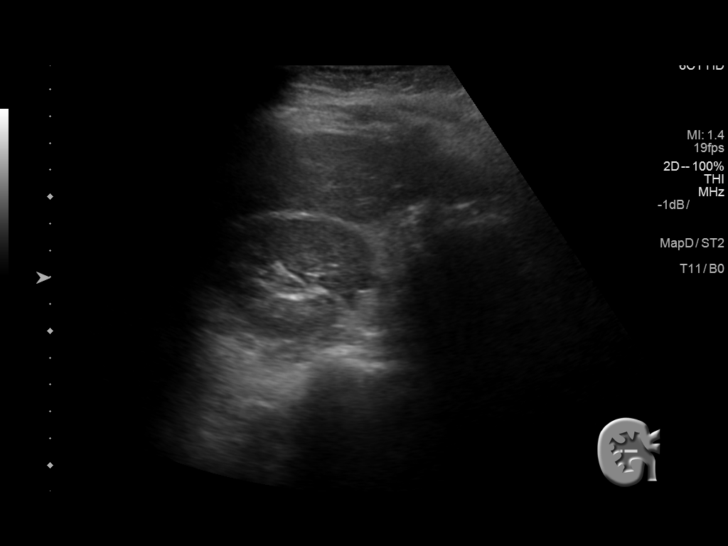
[im 17/36]
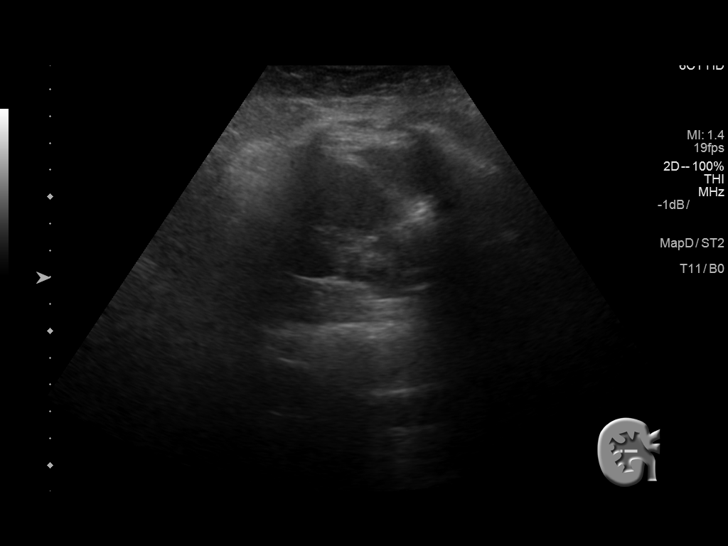
[im 19/36]
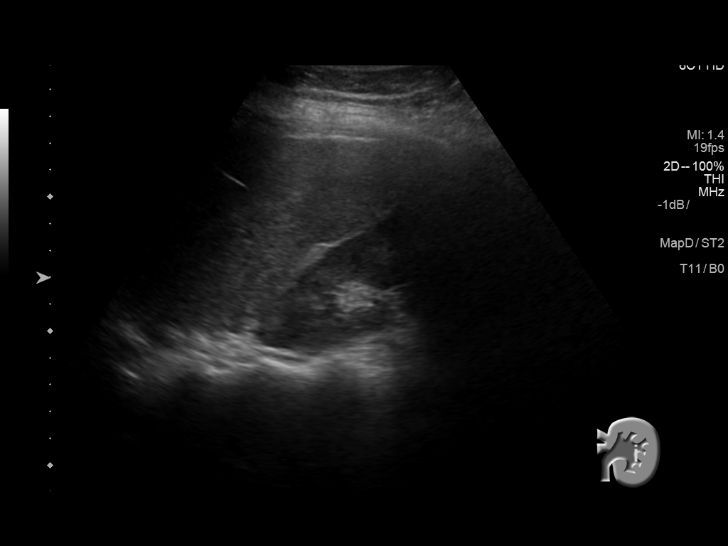
[im 22/36]
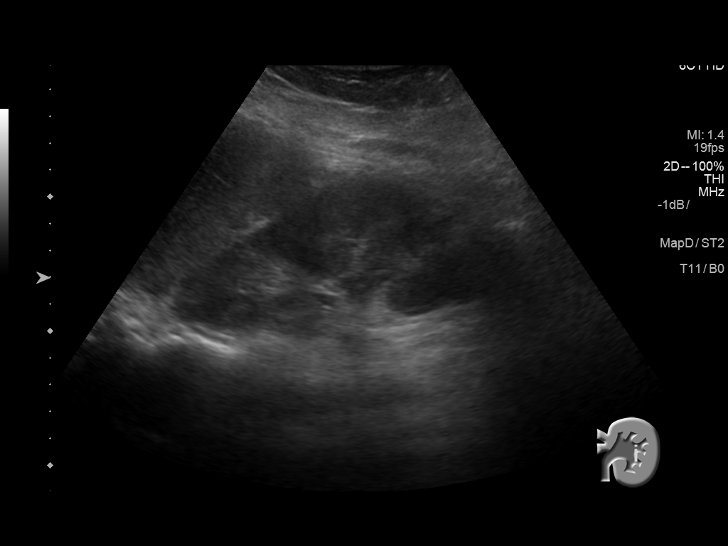
[im 24/36]
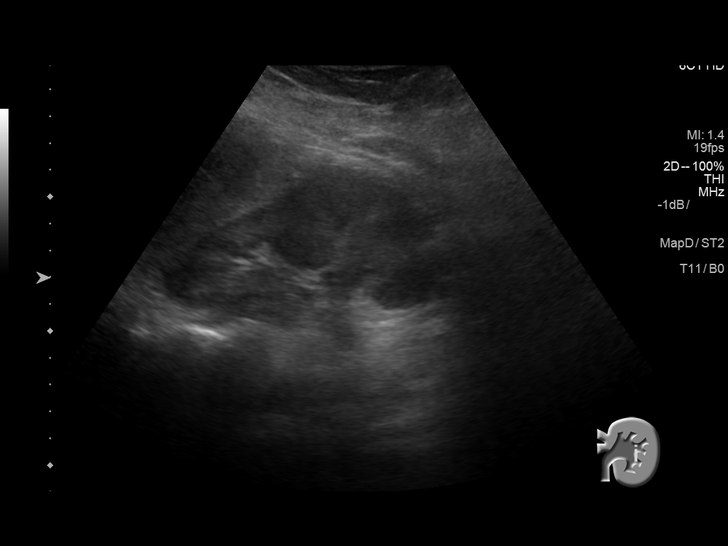
[im 27/36]
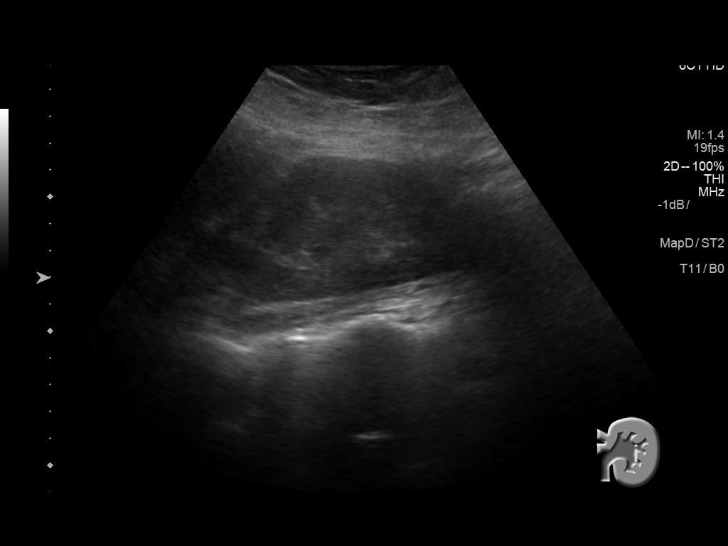
[im 30/36]
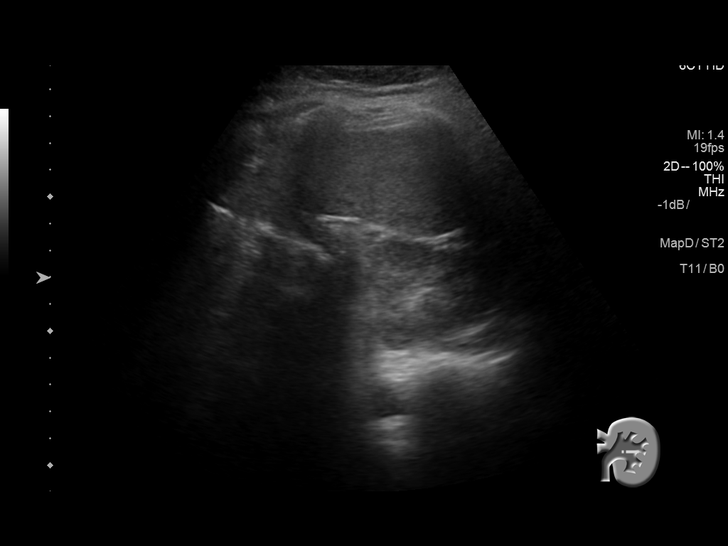
[im 33/36]
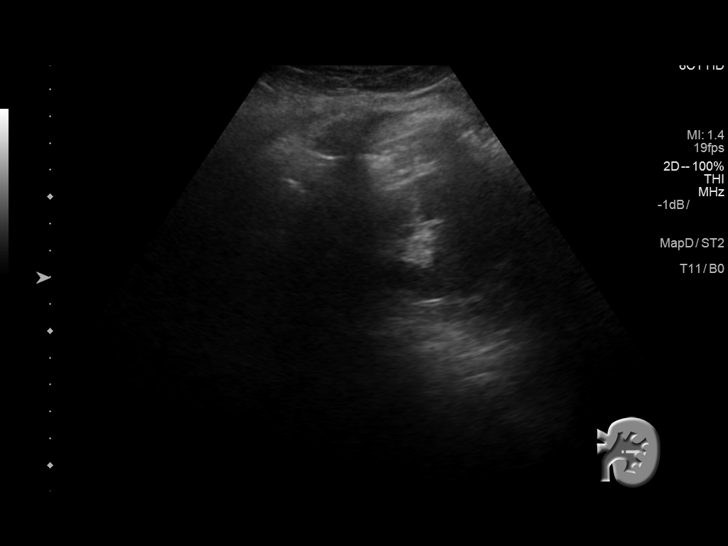
[im 36/36]
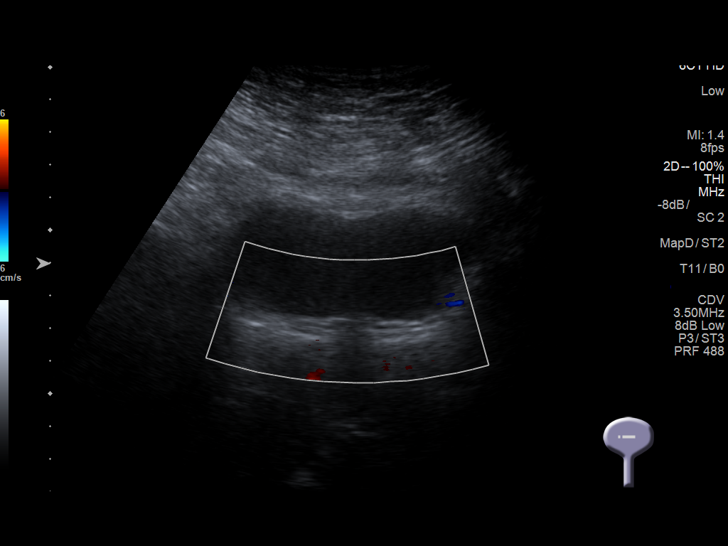

[14 of 25 positions shown; findings below may reference images not displayed]

FINDINGS: Right Kidney:

Length: 11.4 cm. Echogenicity within normal limits. No mass or
hydronephrosis visualized.

Left Kidney:

Length: 12.3 cm. Echogenicity within normal limits. No mass or
hydronephrosis visualized.

Bladder:

Grossly negative although not evaluated in detail as it is
nondistended.

Incidentally noted is defects spleen is upper normal in size with a
span of 12.5 cm. There are no focal splenic abnormalities.
IMPRESSION: Kidneys appear normal.  Borderline splenomegaly.

## 2018-04-09 ENCOUNTER — Emergency Department (HOSPITAL_COMMUNITY)
Admission: EM | Admit: 2018-04-09 | Discharge: 2018-04-09 | Disposition: A | Discharge status: Home / Self Care | Payer: 59 | Source: Home / Self Care | Attending: Emergency Medicine | Admitting: Emergency Medicine

## 2018-04-09 ENCOUNTER — Encounter (HOSPITAL_COMMUNITY): Payer: Self-pay | Admitting: Emergency Medicine

## 2018-04-09 ENCOUNTER — Emergency Department (HOSPITAL_COMMUNITY): Payer: 59

## 2018-04-09 ENCOUNTER — Other Ambulatory Visit: Payer: Self-pay

## 2018-04-09 ENCOUNTER — Emergency Department (HOSPITAL_COMMUNITY)
Admission: EM | Admit: 2018-04-09 | Discharge: 2018-04-09 | Discharge status: Against Medical Advice | Payer: 59 | Attending: Emergency Medicine | Admitting: Emergency Medicine

## 2018-04-09 DIAGNOSIS — I129 Hypertensive chronic kidney disease with stage 1 through stage 4 chronic kidney disease, or unspecified chronic kidney disease: Secondary | ICD-10-CM

## 2018-04-09 DIAGNOSIS — Z5321 Procedure and treatment not carried out due to patient leaving prior to being seen by health care provider: Secondary | ICD-10-CM | POA: Insufficient documentation

## 2018-04-09 DIAGNOSIS — N189 Chronic kidney disease, unspecified: Secondary | ICD-10-CM

## 2018-04-09 DIAGNOSIS — J069 Acute upper respiratory infection, unspecified: Secondary | ICD-10-CM | POA: Insufficient documentation

## 2018-04-09 DIAGNOSIS — R0981 Nasal congestion: Secondary | ICD-10-CM | POA: Diagnosis present

## 2018-04-09 DIAGNOSIS — Z79899 Other long term (current) drug therapy: Secondary | ICD-10-CM

## 2018-04-09 LAB — I-STAT CG4 LACTIC ACID, ED: Lactic Acid, Venous: 1.98 mmol/L — ABNORMAL HIGH (ref 0.5–1.9)

## 2018-04-09 LAB — CBC WITH DIFFERENTIAL/PLATELET
Abs Immature Granulocytes: 0 10*3/uL (ref 0.0–0.1)
BASOS ABS: 0 10*3/uL (ref 0.0–0.1)
BASOS PCT: 0 %
EOS ABS: 0 10*3/uL (ref 0.0–0.7)
Eosinophils Relative: 0 %
HCT: 41.9 % (ref 36.0–46.0)
Hemoglobin: 14.2 g/dL (ref 12.0–15.0)
Immature Granulocytes: 0 %
Lymphocytes Relative: 12 %
Lymphs Abs: 0.9 10*3/uL (ref 0.7–4.0)
MCH: 29.3 pg (ref 26.0–34.0)
MCHC: 33.9 g/dL (ref 30.0–36.0)
MCV: 86.4 fL (ref 78.0–100.0)
MONO ABS: 0.5 10*3/uL (ref 0.1–1.0)
Monocytes Relative: 7 %
NEUTROS PCT: 81 %
Neutro Abs: 5.8 10*3/uL (ref 1.7–7.7)
PLATELETS: 266 10*3/uL (ref 150–400)
RBC: 4.85 MIL/uL (ref 3.87–5.11)
RDW: 12.8 % (ref 11.5–15.5)
WBC: 7.3 10*3/uL (ref 4.0–10.5)

## 2018-04-09 LAB — COMPREHENSIVE METABOLIC PANEL
ALK PHOS: 100 U/L (ref 38–126)
ALT: 26 U/L (ref 14–54)
ANION GAP: 11 (ref 5–15)
AST: 20 U/L (ref 15–41)
Albumin: 3.7 g/dL (ref 3.5–5.0)
BILIRUBIN TOTAL: 0.5 mg/dL (ref 0.3–1.2)
BUN: 8 mg/dL (ref 6–20)
CALCIUM: 8.9 mg/dL (ref 8.9–10.3)
CO2: 22 mmol/L (ref 22–32)
CREATININE: 0.73 mg/dL (ref 0.44–1.00)
Chloride: 107 mmol/L (ref 101–111)
GFR calc non Af Amer: >60 mL/min (ref >60)
GLUCOSE: 149 mg/dL — AB (ref 65–99)
Potassium: 3.5 mmol/L (ref 3.5–5.1)
Sodium: 140 mmol/L (ref 135–145)
TOTAL PROTEIN: 7.2 g/dL (ref 6.5–8.1)

## 2018-04-09 LAB — I-STAT BETA HCG BLOOD, ED (MC, WL, AP ONLY)

## 2018-04-09 MED ORDER — CLINDAMYCIN PHOSPHATE 300 MG/50ML IV SOLN
300.0000 mg | Freq: Once | INTRAVENOUS | Status: AC
  Administered 2018-04-09: 300 mg via INTRAVENOUS
  Filled 2018-04-09: qty 50

## 2018-04-09 MED ORDER — ACETAMINOPHEN 500 MG PO TABS
1000.0000 mg | ORAL_TABLET | Freq: Once | ORAL | Status: AC
Start: 2018-04-09 — End: 2018-04-09
  Administered 2018-04-09: 1000 mg via ORAL
  Filled 2018-04-09: qty 2

## 2018-04-09 MED ORDER — ONDANSETRON HCL 4 MG PO TABS: 4.0000 mg | ORAL_TABLET | Freq: Three times a day (TID) | ORAL | 0 refills | Status: DC | PRN

## 2018-04-09 MED ORDER — KETOROLAC TROMETHAMINE 15 MG/ML IJ SOLN
15.0000 mg | Freq: Once | INTRAMUSCULAR | Status: AC
  Administered 2018-04-09: 15 mg via INTRAVENOUS
  Filled 2018-04-09: qty 1

## 2018-04-09 MED ORDER — DEXAMETHASONE SODIUM PHOSPHATE 10 MG/ML IJ SOLN
10.0000 mg | Freq: Once | INTRAMUSCULAR | Status: AC
  Administered 2018-04-09: 10 mg via INTRAVENOUS
  Filled 2018-04-09: qty 1

## 2018-04-09 MED ORDER — SODIUM CHLORIDE 0.9 % IV BOLUS
1000.0000 mL | Freq: Once | INTRAVENOUS | Status: AC
  Administered 2018-04-09: 1000 mL via INTRAVENOUS

## 2018-04-09 MED ORDER — PHENOL 1.4 % MT LIQD: 1.0000 | OROMUCOSAL | 0 refills | Status: DC | PRN

## 2018-04-09 MED ORDER — CLINDAMYCIN HCL 300 MG PO CAPS: 300.0000 mg | ORAL_CAPSULE | Freq: Three times a day (TID) | ORAL | 0 refills | Status: AC

## 2018-04-09 MED ORDER — ONDANSETRON HCL 4 MG/2ML IJ SOLN
4.0000 mg | Freq: Once | INTRAMUSCULAR | Status: AC
  Administered 2018-04-09: 4 mg via INTRAVENOUS
  Filled 2018-04-09: qty 2

## 2018-04-09 NOTE — Discharge Instructions (Addendum)
Take antibiotics as prescribed. Take the entire course, even if your symptoms improve.  Use Tylenol or ibuprofen as needed for fevers or body aches. Use zofran as needed for nausea or vomiting. Use sore throat spray as needed. You may also try a spoonful of honey. Use cough medicine as needed.  Make sure you stay well-hydrated with water. Wash your hands frequently to prevent spread of infection. Follow-up with your primary care doctor next week if your symptoms are not improving. Return to the emergency room if you develop chest pain, difficulty breathing, or any new or worsening symptoms.

## 2018-04-09 NOTE — ED Triage Notes (Signed)
Pt presents from [facility] for tachycardia, sore throat, bilat ear aches, nasal congestion; pt states she was tachycardic there and the RN gave her a bag of fluid but heart rate remained high so she was sent here for further eval

## 2018-04-09 NOTE — ED Triage Notes (Addendum)
Patient complaining of nasal congestion, sore throat, and bilateral ear pain x 4 days. States she took sudafed and tylenol at 1000 today.

## 2018-04-09 NOTE — ED Provider Notes (Signed)
[facility] Provider Note   CSN: [id number] Arrival date & time: 04/09/18  1723     History   Chief Complaint Chief Complaint  Patient presents with  . Sore Throat  . Nasal Congestion  . Tachycardia    HPI Meghan Calhoun is a 27 y.o. female presenting for evaluation of nasal congestion, sore throat, ear pain, and generalized body aches.  Patient states that her symptoms began 3 days ago.  They have been gradually worsening.  She has been trying over-the-counter medications including Robitussin and Sudafed without improvement of symptoms.  She states she was seen at urgent care this morning, and sent to the ER due to tachycardia she did not improve with a bolus of fluids.  She states cough is mildly productive, yellow sputum.  She has bilateral sore throat and bilateral ear pain.  Generalized body aches.  She had a temperature of 100.4 yesterday.  She reports nausea without vomiting.  She reports one sick contact.  She has no other medical problems, takes no medications daily.  She does not smoke cigarettes or use tobacco products.  Denies history of asthma or COPD.  HPI  Past Medical History:  Diagnosis Date  . Chronic kidney disease   . GERD (gastroesophageal reflux disease)   . Glomerulonephritis   . Hx of varicella   . Hypertension    During pregnancy  . Pregnancy induced hypertension     Patient Active Problem List   Diagnosis Date Noted  . 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 Gestational hypertension - medication controlled 07/29/2017  . Postpartum care following vaginal delivery (10/2) 07/28/2017  . First degree perineal laceration during delivery 07/28/2017  . Encounter for planned induction of labor 07/27/2017  . Pregnancy 08/21/2015    Past Surgical History:  Procedure Laterality Date  . ELBOW SURGERY    . MOUTH SURGERY       OB History    Gravida  2   Para  2   Term  2   Preterm  0   AB  0   Living  2     SAB  0   TAB  0   Ectopic  0   Multiple  0   Live Births  2            Home Medications    Prior to Admission medications   Medication Sig Start Date End Date Taking? Authorizing Provider  acetaminophen (TYLENOL) 325 MG tablet Take 650 mg by mouth every 6 (six) hours as needed for fever.   Yes [provider]  pseudoephedrine (SUDAFED) 30 MG tablet Take 30 mg by mouth every 4 (four) hours as needed for congestion.   Yes [provider]  clindamycin (CLEOCIN) 300 MG capsule Take 1 capsule (300 mg total) by mouth 3 (three) times daily for 7 days. 04/09/18 04/16/18  Rayshon Albaugh, PA-C  ibuprofen (ADVIL,MOTRIN) 600 MG tablet Take 1 tablet (600 mg total) by mouth every 6 (six) hours. Patient not taking: Reported on 04/09/2018 07/29/17   Marlinda MikeBailey, Tanya, CNM  ondansetron (ZOFRAN) 4 MG tablet Take 1 tablet (4 mg total) by mouth every 8 (eight) hours as needed for nausea or vomiting. 04/09/18   Parker Sawatzky, PA-C  phenol (CHLORASEPTIC) 1.4 % LIQD Use as directed 1 spray in the mouth or throat as needed for throat irritation / pain. 04/09/18   Larenzo Caples, PA-C    Family History Family History  Problem Relation Age  of Onset  . Hypertension Mother   . Anxiety disorder Sister     Social History Social History   Tobacco Use  . Smoking status: Never Smoker  . Smokeless tobacco: Never Used  Substance Use Topics  . Alcohol use: No    Comment: occas  . Drug use: No     Allergies   Patient has no known allergies.   Review of Systems Review of Systems  Constitutional: Positive for fever.  HENT: Positive for congestion, ear pain and sore throat.   Respiratory: Positive for cough.   Musculoskeletal: Positive for myalgias.  All other systems reviewed and are negative.    Physical Exam Updated Vital Signs BP 131/87   Pulse (!) 138   Temp 100.3 F (37.9 C) (Oral)   Resp (!) 23   Ht 5\' 6"  (1.676 m)   Wt 87.1 kg (192 lb)   LMP  04/05/2018   SpO2 100%   BMI 30.99 kg/m   Physical Exam  Constitutional: She is oriented to person, place, and time. She appears well-developed and well-nourished. No distress.  Pt appears uncomfortable, but in NAD  HENT:  Head: Normocephalic and atraumatic.  Right Ear: Tympanic membrane, external ear and ear canal normal.  Left Ear: Tympanic membrane, external ear and ear canal normal.  Nose: Mucosal edema present. Right sinus exhibits no maxillary sinus tenderness and no frontal sinus tenderness. Left sinus exhibits no maxillary sinus tenderness and no frontal sinus tenderness.  Mouth/Throat: Uvula is midline and mucous membranes are normal. Posterior oropharyngeal edema and posterior oropharyngeal erythema present. Tonsils are 2+ on the right. Tonsils are 2+ on the left. Tonsillar exudate.  Nasal mucosal edema.  OP erythematous with bilateral tonsillar swelling with exudate.  Uvula midline with equal palate rise.  TMs nonerythematous and nonbulging bilaterally.  Handling secretions easily.  No airway compromise.  Eyes: Pupils are equal, round, and reactive to light. Conjunctivae and EOM are normal.  Neck: Normal range of motion.  Cardiovascular: Regular rhythm and intact distal pulses.  tachycardic  Pulmonary/Chest: Effort normal and breath sounds normal. She has no decreased breath sounds. She has no wheezes. She has no rhonchi. She has no rales.  Pt speaking in full sentences without difficulty. Clear lung sounds in all fields  Abdominal: Soft. She exhibits no distension. There is no tenderness.  Musculoskeletal: Normal range of motion. She exhibits no edema or tenderness.  No leg pain or swelling  Lymphadenopathy:    She has no cervical adenopathy.  Neurological: She is alert and oriented to person, place, and time.  Skin: Skin is warm.  Psychiatric: She has a normal mood and affect.  Nursing note and vitals reviewed.    ED Treatments / Results  Labs (all labs ordered are  listed, but only abnormal results are displayed) Labs Reviewed  COMPREHENSIVE METABOLIC PANEL - Abnormal; Notable for the following components:      Result Value   Glucose, Bld 149 (*)    All other components within normal limits  I-STAT CG4 LACTIC ACID, ED - Abnormal; Notable for the following components:   Lactic Acid, Venous 1.98 (*)    All other components within normal limits  CBC WITH DIFFERENTIAL/PLATELET  I-STAT BETA HCG BLOOD, ED ([facility], [facility], [facility] ONLY)  I-STAT CG4 LACTIC ACID, ED    EKG EKG Interpretation  Date/Time:  Friday April 09 2018 17:37:32 EDT Ventricular Rate:  139 PR Interval:  136 QRS Duration: 80 QT Interval:  282 QTC Calculation: 429 R Axis:  9 Text Interpretation:  Sinus tachycardia Anterior infarct , age undetermined Abnormal ECG rate is faster compared to April 2017 Confirmed by Pricilla Loveless [phone]) on 04/09/2018 6:59:38 PM   Radiology Dg Chest 2 View  Result Date: 04/09/2018 CLINICAL DATA:  Tachycardia sore throat and nasal congestion. EXAM: CHEST - 2 VIEW COMPARISON:  PA and lateral chest 04/09/2018. FINDINGS: The lungs are clear. Heart size is normal. No pneumothorax or pleural fluid. No bony abnormality IMPRESSION: Negative chest. Electronically Signed   By: Drusilla Kanner M.D.   On: 04/09/2018 20:43   Dg Chest 2 View  Result Date: 04/09/2018 CLINICAL DATA:  Cough, sore throat, and fever for 4 days, hypertension, GERD EXAM: CHEST - 2 VIEW COMPARISON:  01/31/2016 FINDINGS: Normal heart size, mediastinal contours, and pulmonary vascularity. Lungs clear. No pleural effusion or pneumothorax. Bones unremarkable. IMPRESSION: Normal exam. Electronically Signed   By: Ulyses Southward M.D.   On: 04/09/2018 13:02    Procedures Procedures (including critical care time)  Medications Ordered in ED Medications  dexamethasone (DECADRON) injection 10 mg (10 mg Intravenous Given 04/09/18 2014)  ketorolac (TORADOL) 15 MG/ML injection 15 mg (15 mg Intravenous Given  04/09/18 2014)  ondansetron (ZOFRAN) injection 4 mg (4 mg Intravenous Given 04/09/18 2014)  clindamycin (CLEOCIN) IVPB 300 mg (300 mg Intravenous New Bag/Given 04/09/18 2116)  sodium chloride 0.9 % bolus 1,000 mL (1,000 mLs Intravenous New Bag/Given 04/09/18 2016)  acetaminophen (TYLENOL) tablet 1,000 mg (1,000 mg Oral Given 04/09/18 2114)     Initial Impression / Assessment and Plan / ED Course  I have reviewed the triage vital signs and the nursing notes.  Pertinent labs & imaging results that were available during my care of the patient were reviewed by me and considered in my medical decision making (see chart for details).     In for evaluation of URI symptoms.  Physical exam shows patient who is tachycardic and afebrile.  She appears uncomfortable but nontoxic.  OP exam shows bilateral tonsillar swelling with exudate, no obvious abscess.  Handling secretions easily.  Will treat symptomatically and reassess.  On reassessment, heart rate has improved to 122, patient states she is feeling better.  Discussed that this is likely a URI, and can be treated symptomatically. Will d/c with abx for possible tonsillitis. F/u with PCP if sxs are not improving.   On repeat evaluation, heart rate improved to 110.  Patient states she continues to feel better.  At this time, patient appears safe for discharge.  Return precautions given.  Patient states she understands and agrees to plan.  Final Clinical Impressions(s) / ED Diagnoses   Final diagnoses:  Upper respiratory tract infection, unspecified type    ED Discharge Orders        Ordered    clindamycin (CLEOCIN) 300 MG capsule  3 times daily     04/09/18 2157    ondansetron (ZOFRAN) 4 MG tablet  Every 8 hours PRN     04/09/18 2157    phenol (CHLORASEPTIC) 1.4 % LIQD  As needed     04/09/18 2157       Alveria Apley, PA-C 04/09/18 2235    Pricilla Loveless, MD 04/10/18 2157

## 2018-09-04 ENCOUNTER — Emergency Department (HOSPITAL_COMMUNITY)
Admission: EM | Admit: 2018-09-04 | Discharge: 2018-09-04 | Disposition: A | Discharge status: Home / Self Care | Payer: PRIVATE HEALTH INSURANCE | Attending: Emergency Medicine | Admitting: Emergency Medicine

## 2018-09-04 ENCOUNTER — Encounter (HOSPITAL_COMMUNITY): Payer: Self-pay | Admitting: *Deleted

## 2018-09-04 ENCOUNTER — Other Ambulatory Visit: Payer: Self-pay

## 2018-09-04 DIAGNOSIS — R0602 Shortness of breath: Secondary | ICD-10-CM | POA: Diagnosis not present

## 2018-09-04 DIAGNOSIS — N189 Chronic kidney disease, unspecified: Secondary | ICD-10-CM | POA: Insufficient documentation

## 2018-09-04 DIAGNOSIS — I129 Hypertensive chronic kidney disease with stage 1 through stage 4 chronic kidney disease, or unspecified chronic kidney disease: Secondary | ICD-10-CM | POA: Diagnosis not present

## 2018-09-04 DIAGNOSIS — F410 Panic disorder [episodic paroxysmal anxiety] without agoraphobia: Secondary | ICD-10-CM | POA: Insufficient documentation

## 2018-09-04 DIAGNOSIS — R079 Chest pain, unspecified: Secondary | ICD-10-CM | POA: Insufficient documentation

## 2018-09-04 MED ORDER — IBUPROFEN 400 MG PO TABS
400.0000 mg | ORAL_TABLET | Freq: Once | ORAL | Status: AC
  Administered 2018-09-04: 400 mg via ORAL
  Filled 2018-09-04: qty 1

## 2018-09-04 MED ORDER — ONDANSETRON 4 MG PO TBDP
4.0000 mg | ORAL_TABLET | Freq: Once | ORAL | Status: AC
  Administered 2018-09-04: 4 mg via ORAL
  Filled 2018-09-04: qty 1

## 2018-09-04 MED ORDER — LORAZEPAM 1 MG PO TABS
1.0000 mg | ORAL_TABLET | Freq: Once | ORAL | Status: AC
  Administered 2018-09-04: 1 mg via ORAL
  Filled 2018-09-04: qty 1

## 2018-09-04 MED ORDER — LORAZEPAM 1 MG PO TABS: 1.0000 mg | ORAL_TABLET | Freq: Four times a day (QID) | ORAL | 0 refills | Status: AC | PRN

## 2018-09-04 NOTE — ED Provider Notes (Signed)
[facility] EMERGENCY DEPARTMENT Provider Note   CSN: [id number] Arrival date & time: 09/04/18  4098     History   Chief Complaint Chief Complaint  Patient presents with  . Panic Attack    HPI Meghan Calhoun is a 27 y.o. female.  The history is provided by the patient.  Anxiety  This is a new problem. The current episode started 1 to 2 hours ago. The problem occurs constantly. The problem has been rapidly worsening. Associated symptoms include chest pain and shortness of breath. Nothing aggravates the symptoms. Nothing relieves the symptoms.   Patient reports long history of panic attacks that typically resolves with deep breathing.  Tonight she woke up at approximately 1:30 AM after having a nightmare.  She reports feeling anxious, heart racing, feeling short of breath.  She also reports diffuse chest pain that is similar to prior episodes.  She reports this episode did not improve so she came to ER.  Denies fever.  She has had some vomiting.  She had otherwise been well prior to going to sleep.  No drug abuse reported, non-smoker Reports she feels safe at home, denies SI Past Medical History:  Diagnosis Date  . Chronic kidney disease   . GERD (gastroesophageal reflux disease)   . Glomerulonephritis   . Hx of varicella   . Hypertension    During pregnancy  . Pregnancy induced hypertension     Patient Active Problem List   Diagnosis Date Noted  . 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 Gestational hypertension - medication controlled 07/29/2017  . Postpartum care following vaginal delivery (10/2) 07/28/2017  . First degree perineal laceration during delivery 07/28/2017  . Encounter for planned induction of labor 07/27/2017  . Pregnancy 08/21/2015    Past Surgical History:  Procedure Laterality Date  . ELBOW SURGERY    . MOUTH SURGERY       OB History    Gravida  2   Para  2   Term  2   Preterm  0   AB  0   Living  2     SAB  0   TAB  0     Ectopic  0   Multiple  0   Live Births  2            Home Medications    Prior to Admission medications   Medication Sig Start Date End Date Taking? Authorizing Provider  acetaminophen (TYLENOL) 325 MG tablet Take 650 mg by mouth every 6 (six) hours as needed for fever.    [provider]    Family History Family History  Problem Relation Age of Onset  . Hypertension Mother   . Anxiety disorder Sister     Social History Social History   Tobacco Use  . Smoking status: Never Smoker  . Smokeless tobacco: Never Used  Substance Use Topics  . Alcohol use: No    Comment: occas  . Drug use: No     Allergies   Patient has no known allergies.   Review of Systems Review of Systems  Constitutional: Negative for fever.  Respiratory: Positive for shortness of breath.   Cardiovascular: Positive for chest pain.  Psychiatric/Behavioral: Negative for suicidal ideas. The patient is nervous/anxious.   All other systems reviewed and are negative.    Physical Exam Updated Vital Signs BP (!) 144/92 (BP Location: Right Arm)   Pulse (!) 114   Temp 98.1 F (36.7 C) (Oral)   Resp Marland Kitchen)  34   Ht 1.676 m (5\' 6" )   Wt 85.3 kg   LMP 08/29/2018   SpO2 100%   BMI 30.34 kg/m   Physical Exam CONSTITUTIONAL: Well developed/well nourished, anxious HEAD: Normocephalic/atraumatic EYES: EOMI/PERRL ENMT: Mucous membranes moist NECK: supple no meningeal signs SPINE/BACK:entire spine nontender CV: S1/S2 noted, no murmurs/rubs/gallops noted LUNGS: Hyperventilating, lungs clear to auscultation bilaterally ABDOMEN: soft, nontender, no rebound or guarding, bowel sounds noted throughout abdomen GU:no cva tenderness NEURO: Pt is awake/alert/appropriate, moves all extremitiesx4.  No facial droop.   EXTREMITIES: pulses normal/equal, full ROM, no lower extremity edema SKIN: warm, color normal PSYCH: Anxious  ED Treatments / Results  Labs (all labs ordered are listed, but  only abnormal results are displayed) Labs Reviewed - No data to display  EKG EKG Interpretation  Date/Time:  Saturday September 04 2018 03:46:15 EST Ventricular Rate:  106 PR Interval:    QRS Duration: 97 QT Interval:  345 QTC Calculation: 459 R Axis:   79 Text Interpretation:  Sinus tachycardia Low voltage, precordial leads Baseline wander in lead(s) V6 No significant change since last tracing Confirmed by Zadie Rhine ([id number]) on 09/04/2018 3:49:37 AM   Radiology No results found.  Procedures Procedures    Medications Ordered in ED Medications  ondansetron (ZOFRAN-ODT) disintegrating tablet 4 mg (4 mg Oral Given 09/04/18 0340)  LORazepam (ATIVAN) tablet 1 mg (1 mg Oral Given 09/04/18 0341)  LORazepam (ATIVAN) tablet 1 mg (1 mg Oral Given 09/04/18 0424)  ondansetron (ZOFRAN-ODT) disintegrating tablet 4 mg (4 mg Oral Given 09/04/18 0424)  ibuprofen (ADVIL,MOTRIN) tablet 400 mg (400 mg Oral Given 09/04/18 0458)     Initial Impression / Assessment and Plan / ED Course  I have reviewed the triage vital signs and the nursing notes.  Pertinent labs & imaging results that were available during my care of the patient were reviewed by me and considered in my medical decision making (see chart for details).   Narcotic database reviewed and considered in decision making  4:34 AM Patient reports this is similar to prior episodes, and she typically has nausea/vomiting/shortness of breath and chest pain.  Ativan and Zofran have been ordered. 4:53 AM Overall patient appears improved.  Her work of breathing is improved.  She reports chest soreness, will give ibuprofen and continue to monitor 5:37 AM Patient feels dramatically improved.  Heart rate is improved.  She is in no acute distress.  She would like to be discharged home. She denies any recent fevers, no cough/hemoptysis.  Denies any known cardiac or pulmonary issues at baseline.  This all appears consistent with a panic attack.   Short course of Ativan has been ordered for patient.  Urged outpatient follow-up. Final Clinical Impressions(s) / ED Diagnoses   Final diagnoses:  Panic attack    ED Discharge Orders         Ordered    LORazepam (ATIVAN) 1 MG tablet  Every 6 hours PRN     09/04/18 0536           Zadie Rhine, MD 09/04/18 [id number]

## 2018-09-04 NOTE — ED Triage Notes (Signed)
Pt c/o having a panic attack that started x one hour ago; pt c/o chest pain and a hard time breathing

## 2019-12-12 ENCOUNTER — Ambulatory Visit: Payer: PRIVATE HEALTH INSURANCE | Attending: Internal Medicine

## 2019-12-12 ENCOUNTER — Other Ambulatory Visit: Payer: Self-pay

## 2019-12-12 DIAGNOSIS — Z20822 Contact with and (suspected) exposure to covid-19: Secondary | ICD-10-CM

## 2019-12-13 LAB — NOVEL CORONAVIRUS, NAA: SARS-CoV-2, NAA: DETECTED — AB

## 2019-12-13 NOTE — Progress Notes (Signed)
Your test for COVID-19 was positive ("detected"), meaning that you were infected with the novel coronavirus and could give the germ to others.    Please continue isolation at home, for at least 10 days since the start of your fever/cough/breathlessness and until you have had 24 hours without fever (without taking a fever reducer) and with any cough/breathlessness improving. Use over-the-counter medications for symptoms.  If you have had no symptoms, but were exposed to someone who was positive for COVID-19, you will need to quarantine and self-isolate for 14 days from the date of exposure.    Please continue good preventive care measures, including:  frequent hand-washing, avoid touching your face, cover coughs/sneezes, stay out of crowds and keep a 6 foot distance from others.  Clean hard surfaces touched frequently with disinfectant cleaning products.   Please check in with your primary care provider about your positive test result.  Go to the nearest urgent care or ED for assessment if you have severe breathlessness or severe weakness/fatigue (ex needing new help getting out of bed or to the bathroom).  Members of your household will also need to quarantine for 14 days from the date of your positive test. You may be contacted to discuss possible treatment options, and you may also be contacted by the health department for follow up. Please call [facility] at [phone] if you have any questions or concerns.     

## 2020-12-30 NOTE — Progress Notes (Deleted)
Office Visit Note  Patient: Meghan Calhoun             Date of Birth: 1991-03-25           MRN: [id number]             PCP: Arita Miss, MD Referring: Arita Miss, MD Visit Date: 12/31/2020 Occupation: @GUAROCC @  Subjective:  No chief complaint on file.   History of Present Illness: Meghan Calhoun is a 30 y.o. 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 She was previously seen at [facility] rheumatology with some swelling and pain that improved to steroid treatment but is not taking any long term treatment or DMARD.***   Labs reviewed 06/2019 ANA 1:640 homogenous RF neg CCP neg SSA neg SSB neg dsDNA 12 C3 C4 wnl   Activities of Daily Living:  Patient reports morning stiffness for *** {minute/hour:19697}.   Patient {ACTIONS;DENIES/REPORTS:21021675::"Denies"} nocturnal pain.  Difficulty dressing/grooming: {ACTIONS;DENIES/REPORTS:21021675::"Denies"} Difficulty climbing stairs: {ACTIONS;DENIES/REPORTS:21021675::"Denies"} Difficulty getting out of chair: {ACTIONS;DENIES/REPORTS:21021675::"Denies"} Difficulty using hands for taps, buttons, cutlery, and/or writing: {ACTIONS;DENIES/REPORTS:21021675::"Denies"}  No Rheumatology ROS completed.   PMFS History:  Patient Active Problem List   Diagnosis Date Noted  . 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 Gestational hypertension - medication controlled 07/29/2017  . Postpartum care following vaginal delivery (10/2) 07/28/2017  . First degree perineal laceration during delivery 07/28/2017  . Encounter for planned induction of labor 07/27/2017  . Pregnancy 08/21/2015    Past Medical History:  Diagnosis Date  . Chronic kidney disease   . GERD (gastroesophageal reflux disease)   . Glomerulonephritis   . Hx of varicella   . Hypertension    During pregnancy  . Pregnancy induced hypertension     Family History  Problem Relation Age of Onset  . Hypertension Mother    . Anxiety disorder Sister    Past Surgical History:  Procedure Laterality Date  . ELBOW SURGERY    . MOUTH SURGERY     Social History   Social History Narrative   ** Merged History Encounter **       There is no immunization history for the selected administration types on file for this patient.   Objective: Vital Signs: There were no vitals taken for this visit.   Physical Exam   Musculoskeletal Exam: ***  CDAI Exam: CDAI Score: -- Patient Global: --; Provider Global: -- Swollen: --; Tender: -- Joint Exam 12/31/2020   No joint exam has been documented for this visit   There is currently no information documented on the homunculus. Go to the Rheumatology activity and complete the homunculus joint exam.  Investigation: No additional findings.  Imaging: No results found.  Recent Labs: Lab Results  Component Value Date   WBC 7.3 04/09/2018   HGB 14.2 04/09/2018   PLT 266 04/09/2018   NA 140 04/09/2018   K 3.5 04/09/2018   CL 107 04/09/2018   CO2 22 04/09/2018   GLUCOSE 149 (H) 04/09/2018   BUN 8 04/09/2018   CREATININE 0.73 04/09/2018   BILITOT 0.5 04/09/2018   ALKPHOS 100 04/09/2018   AST 20 04/09/2018   ALT 26 04/09/2018   PROT 7.2 04/09/2018   ALBUMIN 3.7 04/09/2018   CALCIUM 8.9 04/09/2018   GFRAA >60 04/09/2018    Speciality Comments: No specialty comments available.  Procedures:  No procedures performed Allergies: Patient has no known allergies.   Assessment / Plan:     Visit Diagnoses: No  diagnosis found.  Orders: No orders of the defined types were placed in this encounter.  No orders of the defined types were placed in this encounter.   Face-to-face time spent with patient was *** minutes. Greater than 50% of time was spent in counseling and coordination of care.  Follow-Up Instructions: No follow-ups on file.   Fuller Plan, MD  Note - This record has been created using [employer].  Chart creation errors have been  sought, but may not always  have been located. Such creation errors do not reflect on  the standard of medical care.

## 2020-12-31 ENCOUNTER — Ambulatory Visit: Payer: PRIVATE HEALTH INSURANCE | Admitting: Internal Medicine

## 2021-07-15 ENCOUNTER — Ambulatory Visit: Payer: Self-pay

## 2021-07-15 ENCOUNTER — Encounter: Payer: Self-pay | Admitting: Emergency Medicine

## 2021-07-15 ENCOUNTER — Ambulatory Visit
Admission: EM | Admit: 2021-07-15 | Discharge: 2021-07-15 | Disposition: A | Discharge status: Home / Self Care | Payer: No Typology Code available for payment source | Attending: Family Medicine | Admitting: Family Medicine

## 2021-07-15 ENCOUNTER — Other Ambulatory Visit: Payer: Self-pay

## 2021-07-15 DIAGNOSIS — J0100 Acute maxillary sinusitis, unspecified: Secondary | ICD-10-CM | POA: Diagnosis not present

## 2021-07-15 MED ORDER — AMOXICILLIN-POT CLAVULANATE 875-125 MG PO TABS: 1.0000 | ORAL_TABLET | Freq: Two times a day (BID) | ORAL | 0 refills | Status: DC

## 2021-07-15 MED ORDER — FLUTICASONE PROPIONATE 50 MCG/ACT NA SUSP: 1.0000 | Freq: Every day | NASAL | 2 refills | Status: AC

## 2021-07-15 NOTE — ED Provider Notes (Signed)
[facility]    CSN: [id number] Arrival date & time: 07/15/21  1041      History   Chief Complaint No chief complaint on file.   HPI Meghan Calhoun is a 30 y.o. female.   Patient presenting today with 10-day history of worsening nasal congestion, facial pain and pressure, intermittent fevers, fatigue, cough.  Denies chest pain, shortness of breath, abdominal pain, nausea vomiting diarrhea, sore throat.  No known chronic sinus or allergy issues.  Taking Mucinex cold and sinus with minimal temporary relief.  Negative COVID testing and multiple times since onset of symptoms.  Kids sick with similar symptoms but also COVID-negative.  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   Gestational hypertension - medication controlled 07/29/2017   Postpartum care following vaginal delivery (10/2) 07/28/2017   First degree perineal laceration during delivery 07/28/2017   Encounter for planned induction of labor 07/27/2017   Pregnancy 08/21/2015    Past Surgical History:  Procedure Laterality Date   ELBOW SURGERY     MOUTH SURGERY      OB History     Gravida  2   Para  2   Term  2   Preterm  0   AB  0   Living  2      SAB  0   IAB  0   Ectopic  0   Multiple  0   Live Births  2            Home Medications    Prior to Admission medications   Medication Sig Start Date End Date Taking? Authorizing Provider  amoxicillin-clavulanate (AUGMENTIN) 875-125 MG tablet Take 1 tablet by mouth every 12 (twelve) hours. 07/15/21  Yes Particia Nearing, PA-C  fluticasone [facility]) 50 MCG/ACT nasal spray Place 1 spray into both nostrils daily. 07/15/21  Yes Particia Nearing,  PA-C  acetaminophen (TYLENOL) 325 MG tablet Take 650 mg by mouth every 6 (six) hours as needed for fever.    [provider]  LORazepam (ATIVAN) 1 MG tablet Take 1 tablet (1 mg total) by mouth every 6 (six) hours as needed for anxiety. 09/04/18   Zadie Rhine, MD    Family History Family History  Problem Relation Age of Onset   Hypertension Mother    Anxiety disorder Sister     Social History Social History   Tobacco Use   Smoking status: Never   Smokeless tobacco: Never  Substance Use Topics   Alcohol use: No    Comment: occas   Drug use: No     Allergies   Patient has no known allergies.   Review of Systems Review of Systems Per HPI  Physical Exam Triage Vital Signs ED Triage Vitals  Enc Vitals Group     BP 07/15/21 1237 (!) 143/98     Pulse Rate 07/15/21 1237 89     Resp 07/15/21 1237 18     Temp 07/15/21 1237 98.4 F (36.9 C)     Temp Source 07/15/21 1237 Oral     SpO2 07/15/21 1237 99 %     Weight --      Height --  Head Circumference --      Peak Flow --      Pain Score 07/15/21 1239 6     Pain Loc --      Pain Edu? --      Excl. in GC? --    No data found.  Updated Vital Signs BP (!) 143/98 (BP Location: Right Arm)   Pulse 89   Temp 98.4 F (36.9 C) (Oral)   Resp 18   LMP 07/09/2021 (Exact Date)   SpO2 99%   Visual Acuity Right Eye Distance:   Left Eye Distance:   Bilateral Distance:    Right Eye Near:   Left Eye Near:    Bilateral Near:     Physical Exam Vitals and nursing note reviewed.  Constitutional:      Appearance: Normal appearance. She is not ill-appearing.  HENT:     Head: Atraumatic.     Right Ear: Tympanic membrane normal.     Left Ear: Tympanic membrane normal.     Nose: Congestion present.     Mouth/Throat:     Mouth: Mucous membranes are moist.     Pharynx: Posterior oropharyngeal erythema present. No oropharyngeal exudate.  Eyes:     Extraocular Movements: Extraocular movements intact.      Conjunctiva/sclera: Conjunctivae normal.  Cardiovascular:     Rate and Rhythm: Normal rate and regular rhythm.     Heart sounds: Normal heart sounds.  Pulmonary:     Effort: Pulmonary effort is normal.     Breath sounds: Normal breath sounds. No wheezing or rales.  Abdominal:     General: Bowel sounds are normal. There is no distension.     Palpations: Abdomen is soft.     Tenderness: There is no abdominal tenderness. There is no guarding.  Musculoskeletal:        General: Normal range of motion.     Cervical back: Normal range of motion and neck supple.  Skin:    General: Skin is warm and dry.  Neurological:     Mental Status: She is alert and oriented to person, place, and time.  Psychiatric:        Mood and Affect: Mood normal.        Thought Content: Thought content normal.        Judgment: Judgment normal.     UC Treatments / Results  Labs (all labs ordered are listed, but only abnormal results are displayed) Labs Reviewed - No data to display  EKG   Radiology No results found.  Procedures Procedures (including critical care time)  Medications Ordered in UC Medications - No data to display  Initial Impression / Assessment and Plan / UC Course  I have reviewed the triage vital signs and the nursing notes.  Pertinent labs & imaging results that were available during my care of the patient were reviewed by me and considered in my medical decision making (see chart for details).     Will treat with Augmentin, Mucinex, Flonase, sinus rinses, over-the-counter pain relievers as needed.  Work note given, supportive care and return precautions reviewed.  Final Clinical Impressions(s) / UC Diagnoses   Final diagnoses:  Acute non-recurrent maxillary sinusitis   Discharge Instructions   None    ED Prescriptions     Medication Sig Dispense Auth. Provider   amoxicillin-clavulanate (AUGMENTIN) 875-125 MG tablet Take 1 tablet by mouth every 12 (twelve) hours. 14  tablet Particia Nearing, PA-C   fluticasone [facility]) 50 MCG/ACT nasal spray  Place 1 spray into both nostrils daily. 16 g Particia Nearing, [place]      PDMP not reviewed this encounter.   Particia Nearing, [place] 07/15/21 1345

## 2021-07-15 NOTE — ED Triage Notes (Signed)
Nasal congestion x 10 days. Productive cough with white and greens sputum.  Fever off and on and fatigue with facial pain.  At home covid test was negative.  Kids tested negative for covid at urgent care

## 2021-10-02 ENCOUNTER — Telehealth: Payer: No Typology Code available for payment source | Admitting: Nurse Practitioner

## 2021-10-02 DIAGNOSIS — J029 Acute pharyngitis, unspecified: Secondary | ICD-10-CM

## 2021-10-02 MED ORDER — AMOXICILLIN 875 MG PO TABS: 875.0000 mg | ORAL_TABLET | Freq: Two times a day (BID) | ORAL | 0 refills | Status: DC

## 2021-10-02 MED ORDER — LIDOCAINE VISCOUS HCL 2 % MT SOLN: 15.0000 mL | OROMUCOSAL | 0 refills | Status: AC | PRN

## 2021-10-02 NOTE — Progress Notes (Signed)
Virtual Visit Consent   Meghan Calhoun, you are scheduled for a virtual visit with Meghan Daphine Deutscher, FNP, a [facility] provider, today.     Just as with appointments in the office, your consent must be obtained to participate.  Your consent will be active for this visit and any virtual visit you may have with one of our providers in the next 365 days.     If you have a MyChart account, a copy of this consent can be sent to you electronically.  All virtual visits are billed to your insurance company just like a traditional visit in the office.    As this is a virtual visit, video technology does not allow for your provider to perform a traditional examination.  This may limit your provider's ability to fully assess your condition.  If your provider identifies any concerns that need to be evaluated in person or the need to arrange testing (such as labs, EKG, etc.), we will make arrangements to do so.     Although advances in technology are sophisticated, we cannot ensure that it will always work on either your end or our end.  If the connection with a video visit is poor, the visit may have to be switched to a telephone visit.  With either a video or telephone visit, we are not always able to ensure that we have a secure connection.     I need to obtain your verbal consent now.   Are you willing to proceed with your visit today? YES   JAMAE TISON has provided verbal consent on 10/02/2021 for a virtual visit (video or telephone).   Meghan Daphine Deutscher, FNP   Date: 10/02/2021 6:13 PM   Virtual Visit via Video Note   I, Meghan Calhoun, connected with Meghan Calhoun ([id number], February 14, 1991) on 10/02/21 at  6:15 PM EST by a video-enabled telemedicine application and verified that I am speaking with the correct person using two identifiers.  Location: Patient: Virtual Visit Location Patient: Home Provider: Virtual Visit Location Provider: Mobile   I discussed the limitations of  evaluation and management by telemedicine and the availability of in person appointments. The patient expressed understanding and agreed to proceed.    History of Present Illness: Meghan Calhoun is a 30 y.o. who identifies as a female who was assigned female at birth, and is being seen today for pharyngitis.  HPI: Patient says she developed a fever on Monday. Progressed no sore throat and cough. Has enlarged lymph nodes in neck. Cannot swallow to eat because it hurst so bad. 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   Review of Systems  Constitutional:  Positive for chills, fever (as high as 101) and malaise/fatigue.  HENT:  Positive for congestion and sore throat.   Respiratory:  Positive for cough. Negative for sputum production.   Musculoskeletal:  Negative for myalgias.  Neurological:  Negative for dizziness and headaches.   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   Gestational hypertension - medication controlled 07/29/2017   Postpartum care following vaginal delivery (10/2) 07/28/2017   First degree perineal laceration during delivery 07/28/2017   Encounter for planned induction of labor 07/27/2017   Pregnancy 08/21/2015    Allergies: No Known Allergies Medications:  Current Outpatient Medications:    acetaminophen (TYLENOL) 325 MG tablet, Take 650 mg by mouth every 6 (six) hours as needed for fever., Disp: , Rfl:  amoxicillin-clavulanate (AUGMENTIN) 875-125 MG tablet, Take 1 tablet by mouth every 12 (twelve) hours., Disp: 14 tablet, Rfl: 0   fluticasone (FLONASE) 50 MCG/ACT nasal spray, Place 1 spray into both nostrils daily., Disp: 16 g, Rfl: 2   LORazepam (ATIVAN) 1 MG tablet, Take 1 tablet (1 mg total) by mouth every 6 (six) hours as needed for anxiety., Disp: 5 tablet, Rfl: 0  Observations/Objective: Patient is well-developed, well-nourished in no acute distress.  Resting comfortably  at home.  Head is  normocephalic, atraumatic.  No labored breathing.  Speech is clear and coherent with logical content.  Patient is alert and oriented at baseline.  Post oral pharynx erythematous and edematous  Assessment and Plan:  Meghan Calhoun in today with chief complaint of No chief complaint on file.   1. Pharyngitis, unspecified etiology Force fluids Motrin or tylenol OTC OTC decongestant Throat lozenges if help New toothbrush in 3 days Meds ordered this encounter  Medications   amoxicillin (AMOXIL) 875 MG tablet    Sig: Take 1 tablet (875 mg total) by mouth 2 (two) times daily. 1 po BID    Dispense:  14 tablet    Refill:  0    Order Specific Question:   Supervising Provider    Answer:   MILLER, BRIAN [[id number]]   lidocaine (XYLOCAINE) 2 % solution    Sig: Use as directed 15 mLs in the mouth or throat as needed for mouth pain.    Dispense:  100 mL    Refill:  0    Order Specific Question:   Supervising Provider    Answer:   Eber Hong [[id number]]      Follow Up Instructions: I discussed the assessment and treatment plan with the patient. The patient was provided an opportunity to ask questions and all were answered. The patient agreed with the plan and demonstrated an understanding of the instructions.  A copy of instructions were sent to the patient via MyChart.  The patient was advised to call back or seek an in-person evaluation if the symptoms worsen or if the condition fails to improve as anticipated.  Time:  I spent 10 minutes with the patient via telehealth technology discussing the above problems/concerns.    Meghan Daphine Deutscher, FNP

## 2021-10-02 NOTE — Patient Instructions (Signed)
Pharyngitis ?Pharyngitis is a sore throat (pharynx). This is when there is redness, pain, and swelling in your throat. Most of the time, this condition gets better on its own. In some cases, you may need medicine. ?What are the causes? ?An infection from a virus. ?An infection from bacteria. ?Allergies. ?What increases the risk? ?Being 5-31 years old. ?Being in crowded environments. These include: ?Daycares. ?Schools. ?Dormitories. ?Living in a place with cold temperatures outside. ?Having a weakened disease-fighting (immune) system. ?What are the signs or symptoms? ?Symptoms may vary depending on the cause. Common symptoms include: ?Sore throat. ?Tiredness (fatigue). ?Low-grade fever. ?Stuffy nose. ?Cough. ?Headache. ?Other symptoms may include: ?Glands in the neck (lymph nodes) that are swollen. ?Skin rashes. ?Film on the throat or tonsils. This can be caused by an infection from bacteria. ?Vomiting. ?Red, itchy eyes. ?Loss of appetite. ?Joint pain and muscle aches. ?Tonsils that are temporarily bigger than usual (enlarged). ?How is this treated? ?Many times, treatment is not needed. This condition usually gets better in 3-4 days without treatment. ?If the infection is caused by a bacteria, you may be need to take antibiotics. ?Follow these instructions at home: ?Medicines ?Take over-the-counter and prescription medicines only as told by your doctor. ?If you were prescribed an antibiotic medicine, take it as told by your doctor. Do not stop taking the antibiotic even if you start to feel better. ?Use throat lozenges or sprays to soothe your throat as told by your doctor. ?Children can get pharyngitis. Do not give your child aspirin. ?Managing pain ?To help with pain, try: ?Sipping warm liquids, such as: ?Broth. ?Herbal tea. ?Warm water. ?Eating or drinking cold or frozen liquids, such as frozen ice pops. ?Rinsing your mouth (gargle) with a salt water mixture 3-4 times a day or as needed. ?To make salt water,  dissolve ?-1 tsp (3-6 g) of salt in 1 cup (237 mL) of warm water. ?Do not swallow this mixture. ?Sucking on hard candy or throat lozenges. ?Putting a cool-mist humidifier in your bedroom at night to moisten the air. ?Sitting in the bathroom with the door closed for 5-10 minutes while you run hot water in the shower. ? ?General instructions ? ?Do not smoke or use any products that contain nicotine or tobacco. If you need help quitting, ask your doctor. ?Rest as told by your doctor. ?Drink enough fluid to keep your pee (urine) pale yellow. ?How is this prevented? ?Wash your hands often for at least 20 seconds with soap and water. If soap and water are not available, use hand sanitizer. ?Do not touch your eyes, nose, or mouth with unwashed hands. Wash hands after touching these areas. ?Do not share cups or eating utensils. ?Avoid close contact with people who are sick. ?Contact a doctor if: ?You have large, tender lumps in your neck. ?You have a rash. ?You cough up green, yellow-brown, or bloody spit. ?Get help right away if: ?You have a stiff neck. ?You drool or cannot swallow liquids. ?You cannot drink or take medicines without vomiting. ?You have very bad pain that does not go away with medicine. ?You have problems breathing, and it is not from a stuffy nose. ?You have new pain and swelling in your knees, ankles, wrists, or elbows. ?These symptoms may be an emergency. Get help right away. Call your local emergency services (911 in the [place] ?Do not wait to see if the symptoms will go away. ?Do not drive yourself to the hospital. ?Summary ?Pharyngitis is a sore throat (pharynx). This is   when there is redness, pain, and swelling in your throat. ?Most of the time, pharyngitis gets better on its own. Sometimes, you may need medicine. ?If you were prescribed an antibiotic medicine, take it as told by your doctor. Do not stop taking the antibiotic even if you start to feel better. ?This information is not intended to  replace advice given to you by your health care provider. Make sure you discuss any questions you have with your health care provider. ?Document Revised: 01/09/2021 Document Reviewed: 01/09/2021 ?Elsevier Patient Education ? 2022 Elsevier Inc. ? ?

## 2022-01-09 ENCOUNTER — Telehealth: Payer: Medicaid Other | Admitting: Physician Assistant

## 2022-01-09 DIAGNOSIS — J019 Acute sinusitis, unspecified: Secondary | ICD-10-CM | POA: Diagnosis not present

## 2022-01-09 DIAGNOSIS — B9689 Other specified bacterial agents as the cause of diseases classified elsewhere: Secondary | ICD-10-CM

## 2022-01-09 MED ORDER — AMOXICILLIN-POT CLAVULANATE 875-125 MG PO TABS: 1.0000 | ORAL_TABLET | Freq: Two times a day (BID) | ORAL | 0 refills | Status: DC

## 2022-01-09 NOTE — Progress Notes (Signed)
?Virtual Visit Consent  ? ?Meghan Calhoun, you are scheduled for a virtual visit with a [facility] provider today.   ?  ?Just as with appointments in the office, your consent must be obtained to participate.  Your consent will be active for this visit and any virtual visit you may have with one of our providers in the next 365 days.   ?  ?If you have a MyChart account, a copy of this consent can be sent to you electronically.  All virtual visits are billed to your insurance company just like a traditional visit in the office.   ? ?As this is a virtual visit, video technology does not allow for your provider to perform a traditional examination.  This may limit your provider's ability to fully assess your condition.  If your provider identifies any concerns that need to be evaluated in person or the need to arrange testing (such as labs, EKG, etc.), we will make arrangements to do so.   ?  ?Although advances in technology are sophisticated, we cannot ensure that it will always work on either your end or our end.  If the connection with a video visit is poor, the visit may have to be switched to a telephone visit.  With either a video or telephone visit, we are not always able to ensure that we have a secure connection.    ? ?I need to obtain your verbal consent now.   Are you willing to proceed with your visit today?  ?  ?ADALAYA BYRNES has provided verbal consent on 01/09/2022 for a virtual visit (video or telephone). ?  ?Mar Daring, PA-C  ? ?Date: 01/09/2022 5:40 PM ? ? ?Virtual Visit via Video Note  ? ?Meghan Calhoun, connected with  AVEENA DEPHILLIPS  ([id number], Feb 10, 1991) on 01/09/22 at  5:30 PM EDT by a video-enabled telemedicine application and verified that I am speaking with the correct person using two identifiers. ? ?Location: ?Patient: Virtual Visit Location Patient: Home ?Provider: Virtual Visit Location Provider: Home Office ?  ?I discussed the limitations of evaluation and management by  telemedicine and the availability of in person appointments. The patient expressed understanding and agreed to proceed.   ? ?History of Present Illness: ?Meghan Calhoun is a 31 y.o. who identifies as a female who was assigned female at birth, and is being seen today for possible sinus infection. ? ?HPI: Sinusitis ?This is a new problem. The current episode started in the past 7 days. The problem has been gradually worsening since onset. The maximum temperature recorded prior to her arrival was 100.4 - 100.9 F. The fever has been present for 1 to 2 days. The pain is moderate. Associated symptoms include congestion, coughing, ear pain, headaches, a hoarse voice, sinus pressure and a sore throat. (Rhinorrhea, post nasal drainage) Treatments tried: mucinex fast max, ibuprofen. The treatment provided no relief.   ? ? ?Problems:  ?Patient Active Problem List  ? Diagnosis Date Noted  ? 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 Gestational hypertension - medication controlled 07/29/2017  ? Postpartum care following vaginal delivery (10/2) 07/28/2017  ? First degree perineal laceration during delivery 07/28/2017  ? Encounter for planned induction of labor 07/27/2017  ? Pregnancy 08/21/2015  ?  ?Allergies: No Known Allergies ?Medications:  ?Current Outpatient Medications:  ?  amoxicillin-clavulanate (AUGMENTIN) 875-125 MG tablet, Take 1 tablet by mouth 2 (two) times daily., Disp: 14 tablet, Rfl: 0 ?  acetaminophen (TYLENOL) 325  MG tablet, Take 650 mg by mouth every 6 (six) hours as needed for fever., Disp: , Rfl:  ?  fluticasone (FLONASE) 50 MCG/ACT nasal spray, Place 1 spray into both nostrils daily., Disp: 16 g, Rfl: 2 ?  lidocaine (XYLOCAINE) 2 % solution, Use as directed 15 mLs in the mouth or throat as needed for mouth pain., Disp: 100 mL, Rfl: 0 ?  LORazepam (ATIVAN) 1 MG tablet, Take 1 tablet (1 mg total) by mouth every 6 (six) hours as needed for anxiety., Disp: 5 tablet, Rfl:  0 ? ?Observations/Objective: ?Patient is well-developed, well-nourished in no acute distress.  ?Resting comfortably at home.  ?Head is normocephalic, atraumatic.  ?No labored breathing.  ?Speech is clear and coherent with logical content.  ?Patient is alert and oriented at baseline.  ? ? ?Assessment and Plan: ?1. Acute bacterial sinusitis ?- amoxicillin-clavulanate (AUGMENTIN) 875-125 MG tablet; Take 1 tablet by mouth 2 (two) times daily.  Dispense: 14 tablet; Refill: 0 ? ?- Worsening symptoms that have not responded to OTC medications. ?- Will give augmentin  ?- Steam treatments and humidifier can help ?- Continue allergy medications.  ?- Stay well hydrated and get plenty of rest.  ?- Seek in person evaluation if no symptom improvement or if symptoms worsen. ? ?Follow Up Instructions: ?I discussed the assessment and treatment plan with the patient. The patient was provided an opportunity to ask questions and all were answered. The patient agreed with the plan and demonstrated an understanding of the instructions.  A copy of instructions were sent to the patient via MyChart unless otherwise noted below.  ? ? ?The patient was advised to call back or seek an in-person evaluation if the symptoms worsen or if the condition fails to improve as anticipated. ? ?Time:  ?I spent 9 minutes with the patient via telehealth technology discussing the above problems/concerns.   ? ?Mar Daring, PA-C ?

## 2022-01-09 NOTE — Patient Instructions (Signed)
?Meghan Calhoun, thank you for joining Margaretann Loveless, PA-C for today's virtual visit.  While this provider is not your primary care provider (PCP), if your PCP is located in our provider database this encounter information will be shared with them immediately following your visit. ? ?Consent: ?(Patient) Meghan Calhoun provided verbal consent for this virtual visit at the beginning of the encounter. ? ?Current Medications: ? ?Current Outpatient Medications:  ?  amoxicillin-clavulanate (AUGMENTIN) 875-125 MG tablet, Take 1 tablet by mouth 2 (two) times daily., Disp: 14 tablet, Rfl: 0 ?  acetaminophen (TYLENOL) 325 MG tablet, Take 650 mg by mouth every 6 (six) hours as needed for fever., Disp: , Rfl:  ?  fluticasone (FLONASE) 50 MCG/ACT nasal spray, Place 1 spray into both nostrils daily., Disp: 16 g, Rfl: 2 ?  lidocaine (XYLOCAINE) 2 % solution, Use as directed 15 mLs in the mouth or throat as needed for mouth pain., Disp: 100 mL, Rfl: 0 ?  LORazepam (ATIVAN) 1 MG tablet, Take 1 tablet (1 mg total) by mouth every 6 (six) hours as needed for anxiety., Disp: 5 tablet, Rfl: 0  ? ?Medications ordered in this encounter:  ?Meds ordered this encounter  ?Medications  ? amoxicillin-clavulanate (AUGMENTIN) 875-125 MG tablet  ?  Sig: Take 1 tablet by mouth 2 (two) times daily.  ?  Dispense:  14 tablet  ?  Refill:  0  ?  Order Specific Question:   Supervising Provider  ?  Answer:   Eber Hong [[id number]]  ?  ? ?*If you need refills on other medications prior to your next appointment, please contact your pharmacy* ? ?Follow-Up: ?Call back or seek an in-person evaluation if the symptoms worsen or if the condition fails to improve as anticipated. ? ?Other Instructions ?Sinusitis, Adult ?Sinusitis is inflammation of your sinuses. Sinuses are hollow spaces in the bones around your face. Your sinuses are located: ?Around your eyes. ?In the middle of your forehead. ?Behind your nose. ?In your cheekbones. ?Mucus normally drains out  of your sinuses. When your nasal tissues become inflamed or swollen, mucus can become trapped or blocked. This allows bacteria, viruses, and fungi to grow, which leads to infection. Most infections of the sinuses are caused by a virus. ?Sinusitis can develop quickly. It can last for up to 4 weeks (acute) or for more than 12 weeks (chronic). Sinusitis often develops after a cold. ?What are the causes? ?This condition is caused by anything that creates swelling in the sinuses or stops mucus from draining. This includes: ?Allergies. ?Asthma. ?Infection from bacteria or viruses. ?Deformities or blockages in your nose or sinuses. ?Abnormal growths in the nose (nasal polyps). ?Pollutants, such as chemicals or irritants in the air. ?Infection from fungi (rare). ?What increases the risk? ?You are more likely to develop this condition if you: ?Have a weak body defense system (immune system). ?Do a lot of swimming or diving. ?Overuse nasal sprays. ?Smoke. ?What are the signs or symptoms? ?The main symptoms of this condition are pain and a feeling of pressure around the affected sinuses. Other symptoms include: ?Stuffy nose or congestion. ?Thick drainage from your nose. ?Swelling and warmth over the affected sinuses. ?Headache. ?Upper toothache. ?A cough that may get worse at night. ?Extra mucus that collects in the throat or the back of the nose (postnasal drip). ?Decreased sense of smell and taste. ?Fatigue. ?A fever. ?Sore throat. ?Bad breath. ?How is this diagnosed? ?This condition is diagnosed based on: ?Your symptoms. ?Your medical history. ?A  physical exam. ?Tests to find out if your condition is acute or chronic. This may include: ?Checking your nose for nasal polyps. ?Viewing your sinuses using a device that has a light (endoscope). ?Testing for allergies or bacteria. ?Imaging tests, such as an MRI or CT scan. ?In rare cases, a bone biopsy may be done to rule out more serious types of fungal sinus disease. ?How is  this treated? ?Treatment for sinusitis depends on the cause and whether your condition is chronic or acute. ?If caused by a virus, your symptoms should go away on their own within 10 days. You may be given medicines to relieve symptoms. They include: ?Medicines that shrink swollen nasal passages (topical intranasal decongestants). ?Medicines that treat allergies (antihistamines). ?A spray that eases inflammation of the nostrils (topical intranasal corticosteroids). ?Rinses that help get rid of thick mucus in your nose (nasal saline washes). ?If caused by bacteria, your health care provider may recommend waiting to see if your symptoms improve. Most bacterial infections will get better without antibiotic medicine. You may be given antibiotics if you have: ?A severe infection. ?A weak immune system. ?If caused by narrow nasal passages or nasal polyps, you may need to have surgery. ?Follow these instructions at home: ?Medicines ?Take, use, or apply over-the-counter and prescription medicines only as told by your health care provider. These may include nasal sprays. ?If you were prescribed an antibiotic medicine, take it as told by your health care provider. Do not stop taking the antibiotic even if you start to feel better. ?Hydrate and humidify ? ?Drink enough fluid to keep your urine pale yellow. Staying hydrated will help to thin your mucus. ?Use a cool mist humidifier to keep the humidity level in your home above 50%. ?Inhale steam for 10-15 minutes, 3-4 times a day, or as told by your health care provider. You can do this in the bathroom while a hot shower is running. ?Limit your exposure to cool or dry air. ?Rest ?Rest as much as possible. ?Sleep with your head raised (elevated). ?Make sure you get enough sleep each night. ?General instructions ? ?Apply a warm, moist washcloth to your face 3-4 times a day or as told by your health care provider. This will help with discomfort. ?Wash your hands often with soap and  water to reduce your exposure to germs. If soap and water are not available, use hand sanitizer. ?Do not smoke. Avoid being around people who are smoking (secondhand smoke). ?Keep all follow-up visits as told by your health care provider. This is important. ?Contact a health care provider if: ?You have a fever. ?Your symptoms get worse. ?Your symptoms do not improve within 10 days. ?Get help right away if: ?You have a severe headache. ?You have persistent vomiting. ?You have severe pain or swelling around your face or eyes. ?You have vision problems. ?You develop confusion. ?Your neck is stiff. ?You have trouble breathing. ?Summary ?Sinusitis is soreness and inflammation of your sinuses. Sinuses are hollow spaces in the bones around your face. ?This condition is caused by nasal tissues that become inflamed or swollen. The swelling traps or blocks the flow of mucus. This allows bacteria, viruses, and fungi to grow, which leads to infection. ?If you were prescribed an antibiotic medicine, take it as told by your health care provider. Do not stop taking the antibiotic even if you start to feel better. ?Keep all follow-up visits as told by your health care provider. This is important. ?This information is not intended to replace  advice given to you by your health care provider. Make sure you discuss any questions you have with your health care provider. ?Document Revised: 03/15/2018 Document Reviewed: 03/15/2018 ?Elsevier Patient Education ? 2022 [employer]. ? ? ? ?If you have been instructed to have an in-person evaluation today at a local Urgent Care facility, please use the link below. It will take you to a list of all of our available [facility], including address, phone number and hours of operation. Please do not delay care.  ?[facility] ? ?If you or a family member do not have a primary care provider, use the link below to schedule a visit and establish care. When you choose a [facility] primary care physician or advanced practice provider, you gain a long-term partner in health. ?Find a Primary Care Provider ? ?Learn more about [facility] in-office and virtual care options: ?C

## 2022-10-13 ENCOUNTER — Telehealth: Payer: BC Managed Care – PPO | Admitting: Physician Assistant

## 2022-10-13 DIAGNOSIS — J019 Acute sinusitis, unspecified: Secondary | ICD-10-CM

## 2022-10-13 DIAGNOSIS — B9689 Other specified bacterial agents as the cause of diseases classified elsewhere: Secondary | ICD-10-CM | POA: Diagnosis not present

## 2022-10-13 MED ORDER — PROMETHAZINE-DM 6.25-15 MG/5ML PO SYRP: 5.0000 mL | ORAL_SOLUTION | Freq: Four times a day (QID) | ORAL | 0 refills | Status: DC | PRN

## 2022-10-13 MED ORDER — AMOXICILLIN-POT CLAVULANATE 875-125 MG PO TABS: 1.0000 | ORAL_TABLET | Freq: Two times a day (BID) | ORAL | 0 refills | Status: DC

## 2022-10-13 NOTE — Progress Notes (Signed)
Virtual Visit Consent   Meghan Calhoun, you are scheduled for a virtual visit with a [facility] provider today. Just as with appointments in the office, your consent must be obtained to participate. Your consent will be active for this visit and any virtual visit you may have with one of our providers in the next 365 days. If you have a MyChart account, a copy of this consent can be sent to you electronically.  As this is a virtual visit, video technology does not allow for your provider to perform a traditional examination. This may limit your provider's ability to fully assess your condition. If your provider identifies any concerns that need to be evaluated in person or the need to arrange testing (such as labs, EKG, etc.), we will make arrangements to do so. Although advances in technology are sophisticated, we cannot ensure that it will always work on either your end or our end. If the connection with a video visit is poor, the visit may have to be switched to a telephone visit. With either a video or telephone visit, we are not always able to ensure that we have a secure connection.  By engaging in this virtual visit, you consent to the provision of healthcare and authorize for your insurance to be billed (if applicable) for the services provided during this visit. Depending on your insurance coverage, you may receive a charge related to this service.  I need to obtain your verbal consent now. Are you willing to proceed with your visit today? Meghan Calhoun has provided verbal consent on 10/13/2022 for a virtual visit (video or telephone). Margaretann Loveless, PA-C  Date: 10/13/2022 4:39 PM  Virtual Visit via Video Note   I, Margaretann Loveless, connected with  Meghan Calhoun  ([id number], 10-05-1991) on 10/13/22 at  4:30 PM EST by a video-enabled telemedicine application and verified that I am speaking with the correct person using two identifiers.  Location: Patient: Virtual Visit Location  Patient: Home Provider: Virtual Visit Location Provider: Home Office   I discussed the limitations of evaluation and management by telemedicine and the availability of in person appointments. The patient expressed understanding and agreed to proceed.    History of Present Illness: Meghan Calhoun is a 31 y.o. who identifies as a female who was assigned female at birth, and is being seen today for possible sinus infection.  HPI: Sinusitis This is a new problem. The current episode started in the past 7 days. The problem has been gradually worsening since onset. The maximum temperature recorded prior to her arrival was 101 - 101.9 F. The fever has been present for 1 to 2 days. Associated symptoms include congestion, coughing, headaches, sinus pressure, a sore throat and swollen glands (anterior cervical chain lymph nodes). Pertinent negatives include no chills, ear pain or hoarse voice. (Fatigue, rhinorrhea, post nasal drainage) Treatments tried: benadryl, tylenol, mucinex. The treatment provided no relief.     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   Gestational hypertension - medication controlled 07/29/2017   Postpartum care following vaginal delivery (10/2) 07/28/2017   First degree perineal laceration during delivery 07/28/2017   Encounter for planned induction of labor 07/27/2017   Pregnancy 08/21/2015    Allergies: No Known Allergies Medications:  Current Outpatient Medications:    amoxicillin-clavulanate (AUGMENTIN) 875-125 MG tablet, Take 1 tablet by mouth 2 (two) times daily., Disp: 14 tablet, Rfl: 0  promethazine-dextromethorphan (PROMETHAZINE-DM) 6.25-15 MG/5ML syrup, Take 5 mLs by mouth 4 (four) times daily as needed., Disp: 118 mL, Rfl: 0   acetaminophen (TYLENOL) 325 MG tablet, Take 650 mg by mouth every 6 (six) hours as needed for fever., Disp: , Rfl:    fluticasone (FLONASE) 50 MCG/ACT nasal spray,  Place 1 spray into both nostrils daily., Disp: 16 g, Rfl: 2   lidocaine (XYLOCAINE) 2 % solution, Use as directed 15 mLs in the mouth or throat as needed for mouth pain., Disp: 100 mL, Rfl: 0   LORazepam (ATIVAN) 1 MG tablet, Take 1 tablet (1 mg total) by mouth every 6 (six) hours as needed for anxiety., Disp: 5 tablet, Rfl: 0  Observations/Objective: Patient is well-developed, well-nourished in no acute distress.  Resting comfortably at home.  Head is normocephalic, atraumatic.  No labored breathing.  Speech is clear and coherent with logical content.  Patient is alert and oriented at baseline.    Assessment and Plan: 1. Acute bacterial sinusitis - amoxicillin-clavulanate (AUGMENTIN) 875-125 MG tablet; Take 1 tablet by mouth 2 (two) times daily.  Dispense: 14 tablet; Refill: 0 - promethazine-dextromethorphan (PROMETHAZINE-DM) 6.25-15 MG/5ML syrup; Take 5 mLs by mouth 4 (four) times daily as needed.  Dispense: 118 mL; Refill: 0  - Worsening symptoms that have not responded to OTC medications.  - Will give Augmentin and Promethazine DM for cough - Continue allergy medications.  - Steam and humidifier can help - Stay well hydrated and get plenty of rest.  - Seek in person evaluation if no symptom improvement or if symptoms worsen   Follow Up Instructions: I discussed the assessment and treatment plan with the patient. The patient was provided an opportunity to ask questions and all were answered. The patient agreed with the plan and demonstrated an understanding of the instructions.  A copy of instructions were sent to the patient via MyChart unless otherwise noted below.    The patient was advised to call back or seek an in-person evaluation if the symptoms worsen or if the condition fails to improve as anticipated.  Time:  I spent 8 minutes with the patient via telehealth technology discussing the above problems/concerns.    Margaretann Loveless, PA-C

## 2022-10-13 NOTE — Patient Instructions (Signed)
Caleen Essex, thank you for joining Margaretann Loveless, PA-C for today's virtual visit.  While this provider is not your primary care provider (PCP), if your PCP is located in our provider database this encounter information will be shared with them immediately following your visit.   A [facility] MyChart account gives you access to today's visit and all your visits, tests, and labs performed at [facility] " click here if you don't have a [facility] MyChart account or go to [email or web address]  Consent: (Patient) Caleen Essex provided verbal consent for this virtual visit at the beginning of the encounter.  Current Medications:  Current Outpatient Medications:    amoxicillin-clavulanate (AUGMENTIN) 875-125 MG tablet, Take 1 tablet by mouth 2 (two) times daily., Disp: 14 tablet, Rfl: 0   promethazine-dextromethorphan (PROMETHAZINE-DM) 6.25-15 MG/5ML syrup, Take 5 mLs by mouth 4 (four) times daily as needed., Disp: 118 mL, Rfl: 0   acetaminophen (TYLENOL) 325 MG tablet, Take 650 mg by mouth every 6 (six) hours as needed for fever., Disp: , Rfl:    fluticasone (FLONASE) 50 MCG/ACT nasal spray, Place 1 spray into both nostrils daily., Disp: 16 g, Rfl: 2   lidocaine (XYLOCAINE) 2 % solution, Use as directed 15 mLs in the mouth or throat as needed for mouth pain., Disp: 100 mL, Rfl: 0   LORazepam (ATIVAN) 1 MG tablet, Take 1 tablet (1 mg total) by mouth every 6 (six) hours as needed for anxiety., Disp: 5 tablet, Rfl: 0   Medications ordered in this encounter:  Meds ordered this encounter  Medications   amoxicillin-clavulanate (AUGMENTIN) 875-125 MG tablet    Sig: Take 1 tablet by mouth 2 (two) times daily.    Dispense:  14 tablet    Refill:  0    Order Specific Question:   Supervising Provider    Answer:   Merrilee Jansky [id number]   promethazine-dextromethorphan (PROMETHAZINE-DM) 6.25-15 MG/5ML syrup    Sig: Take 5 mLs by mouth 4 (four) times daily as needed.     Dispense:  118 mL    Refill:  0    Order Specific Question:   Supervising Provider    Answer:   Merrilee Jansky [[id number]]     *If you need refills on other medications prior to your next appointment, please contact your pharmacy*  Follow-Up: Call back or seek an in-person evaluation if the symptoms worsen or if the condition fails to improve as anticipated.  [facility] [phone]  Other Instructions Sinus Infection, Adult A sinus infection, also called sinusitis, is inflammation of your sinuses. Sinuses are hollow spaces in the bones around your face. Your sinuses are located: Around your eyes. In the middle of your forehead. Behind your nose. In your cheekbones. Mucus normally drains out of your sinuses. When your nasal tissues become inflamed or swollen, mucus can become trapped or blocked. This allows bacteria, viruses, and fungi to grow, which leads to infection. Most infections of the sinuses are caused by a virus. A sinus infection can develop quickly. It can last for up to 4 weeks (acute) or for more than 12 weeks (chronic). A sinus infection often develops after a cold. What are the causes? This condition is caused by anything that creates swelling in the sinuses or stops mucus from draining. This includes: Allergies. Asthma. Infection from bacteria or viruses. Deformities or blockages in your nose or sinuses. Abnormal growths in the nose (nasal polyps). Pollutants, such as chemicals or irritants in  the air. Infection from fungi. This is rare. What increases the risk? You are more likely to develop this condition if you: Have a weak body defense system (immune system). Do a lot of swimming or diving. Overuse nasal sprays. Smoke. What are the signs or symptoms? The main symptoms of this condition are pain and a feeling of pressure around the affected sinuses. Other symptoms include: Stuffy nose or congestion that makes it difficult to breathe through  your nose. Thick yellow or greenish drainage from your nose. Tenderness, swelling, and warmth over the affected sinuses. A cough that may get worse at night. Decreased sense of smell and taste. Extra mucus that collects in the throat or the back of the nose (postnasal drip) causing a sore throat or bad breath. Tiredness (fatigue). Fever. How is this diagnosed? This condition is diagnosed based on: Your symptoms. Your medical history. A physical exam. Tests to find out if your condition is acute or chronic. This may include: Checking your nose for nasal polyps. Viewing your sinuses using a device that has a light (endoscope). Testing for allergies or bacteria. Imaging tests, such as an MRI or CT scan. In rare cases, a bone biopsy may be done to rule out more serious types of fungal sinus disease. How is this treated? Treatment for a sinus infection depends on the cause and whether your condition is chronic or acute. If caused by a virus, your symptoms should go away on their own within 10 days. You may be given medicines to relieve symptoms. They include: Medicines that shrink swollen nasal passages (decongestants). A spray that eases inflammation of the nostrils (topical intranasal corticosteroids). Rinses that help get rid of thick mucus in your nose (nasal saline washes). Medicines that treat allergies (antihistamines). Over-the-counter pain relievers. If caused by bacteria, your health care provider may recommend waiting to see if your symptoms improve. Most bacterial infections will get better without antibiotic medicine. You may be given antibiotics if you have: A severe infection. A weak immune system. If caused by narrow nasal passages or nasal polyps, surgery may be needed. Follow these instructions at home: Medicines Take, use, or apply over-the-counter and prescription medicines only as told by your health care provider. These may include nasal sprays. If you were  prescribed an antibiotic medicine, take it as told by your health care provider. Do not stop taking the antibiotic even if you start to feel better. Hydrate and humidify  Drink enough fluid to keep your urine pale yellow. Staying hydrated will help to thin your mucus. Use a cool mist humidifier to keep the humidity level in your home above 50%. Inhale steam for 10-15 minutes, 3-4 times a day, or as told by your health care provider. You can do this in the bathroom while a hot shower is running. Limit your exposure to cool or dry air. Rest Rest as much as possible. Sleep with your head raised (elevated). Make sure you get enough sleep each night. General instructions  Apply a warm, moist washcloth to your face 3-4 times a day or as told by your health care provider. This will help with discomfort. Use nasal saline washes as often as told by your health care provider. Wash your hands often with soap and water to reduce your exposure to germs. If soap and water are not available, use hand sanitizer. Do not smoke. Avoid being around people who are smoking (secondhand smoke). Keep all follow-up visits. This is important. Contact a health care provider if:  You have a fever. Your symptoms get worse. Your symptoms do not improve within 10 days. Get help right away if: You have a severe headache. You have persistent vomiting. You have severe pain or swelling around your face or eyes. You have vision problems. You develop confusion. Your neck is stiff. You have trouble breathing. These symptoms may be an emergency. Get help right away. Call 911. Do not wait to see if the symptoms will go away. Do not drive yourself to the hospital. Summary A sinus infection is soreness and inflammation of your sinuses. Sinuses are hollow spaces in the bones around your face. This condition is caused by nasal tissues that become inflamed or swollen. The swelling traps or blocks the flow of mucus. This allows  bacteria, viruses, and fungi to grow, which leads to infection. If you were prescribed an antibiotic medicine, take it as told by your health care provider. Do not stop taking the antibiotic even if you start to feel better. Keep all follow-up visits. This is important. This information is not intended to replace advice given to you by your health care provider. Make sure you discuss any questions you have with your health care provider. Document Revised: 09/17/2021 Document Reviewed: 09/17/2021 Elsevier Patient Education  2023 [employer]    If you have been instructed to have an in-person evaluation today at a local Urgent Care facility, please use the link below. It will take you to a list of all of our available [facility], including address, phone number and hours of operation. Please do not delay care.  [facility]  If you or a family member do not have a primary care provider, use the link below to schedule a visit and establish care. When you choose a [facility] primary care physician or advanced practice provider, you gain a long-term partner in health. Find a Primary Care Provider  Learn more about [facility] in-office and virtual care options: [facility] - Get Care Now

## 2023-01-14 ENCOUNTER — Telehealth: Payer: BC Managed Care – PPO | Admitting: Physician Assistant

## 2023-01-14 DIAGNOSIS — J020 Streptococcal pharyngitis: Secondary | ICD-10-CM | POA: Diagnosis not present

## 2023-01-14 MED ORDER — AMOXICILLIN 875 MG PO TABS: 875.0000 mg | ORAL_TABLET | Freq: Two times a day (BID) | ORAL | 0 refills | Status: AC

## 2023-01-14 NOTE — Progress Notes (Signed)
Virtual Visit Consent   Meghan Calhoun, you are scheduled for a virtual visit with a [facility] provider today. Just as with appointments in the office, your consent must be obtained to participate. Your consent will be active for this visit and any virtual visit you may have with one of our providers in the next 365 days. If you have a MyChart account, a copy of this consent can be sent to you electronically.  As this is a virtual visit, video technology does not allow for your provider to perform a traditional examination. This may limit your provider's ability to fully assess your condition. If your provider identifies any concerns that need to be evaluated in person or the need to arrange testing (such as labs, EKG, etc.), we will make arrangements to do so. Although advances in technology are sophisticated, we cannot ensure that it will always work on either your end or our end. If the connection with a video visit is poor, the visit may have to be switched to a telephone visit. With either a video or telephone visit, we are not always able to ensure that we have a secure connection.  By engaging in this virtual visit, you consent to the provision of healthcare and authorize for your insurance to be billed (if applicable) for the services provided during this visit. Depending on your insurance coverage, you may receive a charge related to this service.  I need to obtain your verbal consent now. Are you willing to proceed with your visit today? Meghan Calhoun has provided verbal consent on 01/14/2023 for a virtual visit (video or telephone). Mar Daring, PA-C  Date: 01/14/2023 1:34 PM  Virtual Visit via Video Note   I, Mar Daring, connected with  Meghan Calhoun  ([id number], 08/05/1991) on 01/14/23 at  1:30 PM EDT by a video-enabled telemedicine application and verified that I am speaking with the correct person using two identifiers.  Location: Patient: Virtual Visit Location  Patient: Home Provider: Virtual Visit Location Provider: Home Office   I discussed the limitations of evaluation and management by telemedicine and the availability of in person appointments. The patient expressed understanding and agreed to proceed.    History of Present Illness: Meghan Calhoun is a 32 y.o. who identifies as a female who was assigned female at birth, and is being seen today for sore throat.  HPI: Sore Throat  This is a new problem. The current episode started in the past 7 days. The problem has been gradually worsening. The maximum temperature recorded prior to her arrival was 101 - 101.9 F. The fever has been present for Less than 1 day. The pain is mild. Associated symptoms include congestion, coughing, ear pain (with swallowing), headaches, a hoarse voice, swollen glands, trouble swallowing and vomiting (twice yesterday). Pertinent negatives include no abdominal pain, diarrhea, ear discharge, plugged ear sensation, shortness of breath or stridor. She has had exposure to strep. Exposure to: husband diagnosed yesterday. She has tried acetaminophen and NSAIDs (mucinex) for the symptoms. The treatment provided no relief.     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   Gestational hypertension - medication controlled 07/29/2017   Postpartum care following vaginal delivery (10/2) 07/28/2017   First degree perineal laceration during delivery 07/28/2017   Encounter for planned induction of labor 07/27/2017   Pregnancy 08/21/2015    Allergies: No Known Allergies Medications:  Current Outpatient Medications:  amoxicillin (AMOXIL) 875 MG tablet, Take 1 tablet (875 mg total) by mouth 2 (two) times daily for 10 days., Disp: 20 tablet, Rfl: 0   acetaminophen (TYLENOL) 325 MG tablet, Take 650 mg by mouth every 6 (six) hours as needed for fever., Disp: , Rfl:    fluticasone (FLONASE) 50 MCG/ACT nasal spray,  Place 1 spray into both nostrils daily., Disp: 16 g, Rfl: 2   lidocaine (XYLOCAINE) 2 % solution, Use as directed 15 mLs in the mouth or throat as needed for mouth pain., Disp: 100 mL, Rfl: 0   LORazepam (ATIVAN) 1 MG tablet, Take 1 tablet (1 mg total) by mouth every 6 (six) hours as needed for anxiety., Disp: 5 tablet, Rfl: 0   promethazine-dextromethorphan (PROMETHAZINE-DM) 6.25-15 MG/5ML syrup, Take 5 mLs by mouth 4 (four) times daily as needed., Disp: 118 mL, Rfl: 0  Observations/Objective: Patient is well-developed, well-nourished in no acute distress.  Resting comfortably at home.  Head is normocephalic, atraumatic.  No labored breathing.  Speech is clear and coherent with logical content.  Patient is alert and oriented at baseline.    Assessment and Plan: 1. Strep pharyngitis - amoxicillin (AMOXIL) 875 MG tablet; Take 1 tablet (875 mg total) by mouth 2 (two) times daily for 10 days.  Dispense: 20 tablet; Refill: 0  - Suspect strep throat - Amoxicillin prescribed - Tylenol and Ibuprofen alternating every 4 hours - Salt water gargles - Chloraseptic spray - Liquid and soft food diet - Push fluids - New toothbrush in 3 days - Seek in person evaluation if not improving or if symptoms worsen   Follow Up Instructions: I discussed the assessment and treatment plan with the patient. The patient was provided an opportunity to ask questions and all were answered. The patient agreed with the plan and demonstrated an understanding of the instructions.  A copy of instructions were sent to the patient via MyChart unless otherwise noted below.    The patient was advised to call back or seek an in-person evaluation if the symptoms worsen or if the condition fails to improve as anticipated.  Time:  I spent 10 minutes with the patient via telehealth technology discussing the above problems/concerns.    Mar Daring, PA-C

## 2023-01-14 NOTE — Patient Instructions (Signed)
Meghan Calhoun, thank you for joining Mar Daring, PA-C for today's virtual visit.  While this provider is not your primary care provider (PCP), if your PCP is located in our provider database this encounter information will be shared with them immediately following your visit.   [facility] account gives you access to today's visit and all your visits, tests, and labs performed at [facility] " click here if you don't have a [name] account or go to [email or web address]  Consent: (Patient) Meghan Calhoun provided verbal consent for this virtual visit at the beginning of the encounter.  Current Medications:  Current Outpatient Medications:    amoxicillin (AMOXIL) 875 MG tablet, Take 1 tablet (875 mg total) by mouth 2 (two) times daily for 10 days., Disp: 20 tablet, Rfl: 0   acetaminophen (TYLENOL) 325 MG tablet, Take 650 mg by mouth every 6 (six) hours as needed for fever., Disp: , Rfl:    fluticasone (FLONASE) 50 MCG/ACT nasal spray, Place 1 spray into both nostrils daily., Disp: 16 g, Rfl: 2   lidocaine (XYLOCAINE) 2 % solution, Use as directed 15 mLs in the mouth or throat as needed for mouth pain., Disp: 100 mL, Rfl: 0   LORazepam (ATIVAN) 1 MG tablet, Take 1 tablet (1 mg total) by mouth every 6 (six) hours as needed for anxiety., Disp: 5 tablet, Rfl: 0   promethazine-dextromethorphan (PROMETHAZINE-DM) 6.25-15 MG/5ML syrup, Take 5 mLs by mouth 4 (four) times daily as needed., Disp: 118 mL, Rfl: 0   Medications ordered in this encounter:  Meds ordered this encounter  Medications   amoxicillin (AMOXIL) 875 MG tablet    Sig: Take 1 tablet (875 mg total) by mouth 2 (two) times daily for 10 days.    Dispense:  20 tablet    Refill:  0    Order Specific Question:   Supervising Provider    Answer:   Chase Picket [id number]     *If you need refills on other medications prior to your next appointment, please contact your  pharmacy*  Follow-Up: Call back or seek an in-person evaluation if the symptoms worsen or if the condition fails to improve as anticipated.  [place] [phone]  Other Instructions  Strep Throat, Adult Strep throat is an infection in the throat that is caused by bacteria. It is common during the cold months of the year. It mostly affects children who are 66-40 years old. However, people of all ages can get it at any time of the year. This infection spreads from person to person (is contagious) through coughing, sneezing, or having close contact. Your health care provider may use other names to describe the infection. When strep throat affects the tonsils, it is called tonsillitis. When it affects the back of the throat, it is called pharyngitis. What are the causes? This condition is caused by the Streptococcus pyogenes bacteria. What increases the risk? You are more likely to develop this condition if: You care for school-age children, or are around school-age children. Children are more likely to get strep throat and may spread it to others. You spend time in crowded places where the infection can spread easily. You have close contact with someone who has strep throat. What are the signs or symptoms? Symptoms of this condition include: Fever or chills. Redness, swelling, or pain in the tonsils or throat. Pain or difficulty when swallowing. White or yellow spots on the tonsils or throat. Tender glands  in the neck and under the jaw. Bad smelling breath. Red rash all over the body. This is rare. How is this diagnosed? This condition is diagnosed by tests that check for the presence and the amount of bacteria that cause strep throat. They are: Rapid strep test. Your throat is swabbed and checked for the presence of bacteria. Results are usually ready in minutes. Throat culture test. Your throat is swabbed. The sample is placed in a cup that allows infections to grow.  Results are usually ready in 1 or 2 days. How is this treated? This condition may be treated with: Medicines that kill germs (antibiotics). Medicines that relieve pain or fever. These include: Ibuprofen or acetaminophen. Aspirin, only for people who are over the age of 25. Throat lozenges. Throat sprays. Follow these instructions at home: Medicines  Take over-the-counter and prescription medicines only as told by your health care provider. Take your antibiotic medicine as told by your health care provider. Do not stop taking the antibiotic even if you start to feel better. Eating and drinking  If you have trouble swallowing, try eating soft foods until your sore throat feels better. Drink enough fluid to keep your urine pale yellow. To help relieve pain, you may have: Warm fluids, such as soup and tea. Cold fluids, such as frozen desserts or popsicles. General instructions Gargle with a salt-water mixture 3-4 times a day or as needed. To make a salt-water mixture, completely dissolve -1 tsp (3-6 g) of salt in 1 cup (237 mL) of warm water. Get plenty of rest. Stay home from work or school until you have been taking antibiotics for 24 hours. Do not use any products that contain nicotine or tobacco. These products include cigarettes, chewing tobacco, and vaping devices, such as e-cigarettes. If you need help quitting, ask your health care provider. It is up to you to get your test results. Ask your health care provider, or the department that is doing the test, when your results will be ready. Keep all follow-up visits. This is important. How is this prevented?  Do not share food, drinking cups, or personal items that could cause the infection to spread to other people. Wash your hands often with soap and water for at least 20 seconds. If soap and water are not available, use hand sanitizer. Make sure that all people in your house wash their hands well. Have family members tested if  they have a sore throat or fever. They may need an antibiotic if they have strep throat. Contact a health care provider if: You have swelling in your neck that keeps getting bigger. You develop a rash, cough, or earache. You cough up a thick mucus that is green, yellow-brown, or bloody. You have pain or discomfort that does not get better with medicine. Your symptoms seem to be getting worse. You have a fever. Get help right away if: You have new symptoms, such as vomiting, severe headache, stiff or painful neck, chest pain, or shortness of breath. You have severe throat pain, drooling, or changes in your voice. You have swelling of the neck, or the skin on the neck becomes red and tender. You have signs of dehydration, such as tiredness (fatigue), dry mouth, and decreased urination. You become increasingly sleepy, or you cannot wake up completely. Your joints become red or painful. These symptoms may represent a serious problem that is an emergency. Do not wait to see if the symptoms will go away. Get medical help right away. Call  your local emergency services (911 in the [place] Do not drive yourself to the hospital. Summary Strep throat is an infection in the throat that is caused by the Streptococcus pyogenes bacteria. This infection is spread from person to person (is contagious) through coughing, sneezing, or having close contact. Take your medicines, including antibiotics, as told by your health care provider. Do not stop taking the antibiotic even if you start to feel better. To prevent the spread of germs, wash your hands well with soap and water. Have others do the same. Do not share food, drinking cups, or personal items. Get help right away if you have new symptoms, such as vomiting, severe headache, stiff or painful neck, chest pain, or shortness of breath. This information is not intended to replace advice given to you by your health care provider. Make sure you discuss any  questions you have with your health care provider. Document Revised: 02/05/2021 Document Reviewed: 02/05/2021 Elsevier Patient Education  [employer].    If you have been instructed to have an in-person evaluation today at a local Urgent Care facility, please use the link below. It will take you to a list of all of our available [facility], including address, phone number and hours of operation. Please do not delay care.  [facility]  If you or a family member do not have a primary care provider, use the link below to schedule a visit and establish care. When you choose a [facility] primary care physician or advanced practice provider, you gain a long-term partner in health. Find a Primary Care Provider  Learn more about [facility]'s in-office and virtual care options: [facility]

## 2023-09-25 ENCOUNTER — Telehealth: Payer: BC Managed Care – PPO | Admitting: Family Medicine

## 2023-09-25 DIAGNOSIS — J019 Acute sinusitis, unspecified: Secondary | ICD-10-CM

## 2023-09-25 DIAGNOSIS — B9689 Other specified bacterial agents as the cause of diseases classified elsewhere: Secondary | ICD-10-CM

## 2023-09-26 MED ORDER — AMOXICILLIN-POT CLAVULANATE 875-125 MG PO TABS: 1.0000 | ORAL_TABLET | Freq: Two times a day (BID) | ORAL | 0 refills | Status: DC

## 2023-09-26 NOTE — Progress Notes (Signed)
E-Visit for Sinus Problems  We are sorry that you are not feeling well.  Here is how we plan to help!  Based on what you have shared with me it looks like you have sinusitis.  Sinusitis is inflammation and infection in the sinus cavities of the head.  Based on your presentation I believe you most likely have Acute Bacterial Sinusitis.  This is an infection caused by bacteria and is treated with antibiotics. I have prescribed Augmentin 875mg/125mg one tablet twice daily with food, for 7 days. You may use an oral decongestant such as Mucinex D or if you have glaucoma or high blood pressure use plain Mucinex. Saline nasal spray help and can safely be used as often as needed for congestion.  If you develop worsening sinus pain, fever or notice severe headache and vision changes, or if symptoms are not better after completion of antibiotic, please schedule an appointment with a health care provider.    Sinus infections are not as easily transmitted as other respiratory infection, however we still recommend that you avoid close contact with loved ones, especially the very young and elderly.  Remember to wash your hands thoroughly throughout the day as this is the number one way to prevent the spread of infection!  Home Care: Only take medications as instructed by your medical team. Complete the entire course of an antibiotic. Do not take these medications with alcohol. A steam or ultrasonic humidifier can help congestion.  You can place a towel over your head and breathe in the steam from hot water coming from a faucet. Avoid close contacts especially the very young and the elderly. Cover your mouth when you cough or sneeze. Always remember to wash your hands.  Get Help Right Away If: You develop worsening fever or sinus pain. You develop a severe head ache or visual changes. Your symptoms persist after you have completed your treatment plan.  Make sure you Understand these instructions. Will watch  your condition. Will get help right away if you are not doing well or get worse.  Thank you for choosing an e-visit.  Your e-visit answers were reviewed by a board certified advanced clinical practitioner to complete your personal care plan. Depending upon the condition, your plan could have included both over the counter or prescription medications.  Please review your pharmacy choice. Make sure the pharmacy is open so you can pick up prescription now. If there is a problem, you may contact your provider through MyChart messaging and have the prescription routed to another pharmacy.  Your safety is important to us. If you have drug allergies check your prescription carefully.   For the next 24 hours you can use MyChart to ask questions about today's visit, request a non-urgent call back, or ask for a work or school excuse. You will get an email in the next two days asking about your experience. I hope that your e-visit has been valuable and will speed your recovery.    have provided 5 minutes of non face to face time during this encounter for chart review and documentation.   

## 2023-10-17 ENCOUNTER — Telehealth: Payer: BC Managed Care – PPO | Admitting: Family Medicine

## 2023-10-17 DIAGNOSIS — J019 Acute sinusitis, unspecified: Secondary | ICD-10-CM

## 2023-10-17 MED ORDER — AMOXICILLIN-POT CLAVULANATE 875-125 MG PO TABS: 1.0000 | ORAL_TABLET | Freq: Two times a day (BID) | ORAL | 0 refills | Status: DC

## 2023-10-17 NOTE — Progress Notes (Signed)

## 2023-10-17 NOTE — Addendum Note (Signed)
Addended byLaure Kidney on: 10/17/2023 06:33 PM   Modules accepted: Orders

## 2023-11-18 ENCOUNTER — Ambulatory Visit
Admission: RE | Admit: 2023-11-18 | Discharge: 2023-11-18 | Disposition: A | Discharge status: Home / Self Care | Payer: 59 | Source: Ambulatory Visit | Attending: Nurse Practitioner | Admitting: Nurse Practitioner

## 2023-11-18 VITALS — BP 152/104 | HR 130 | Temp 98.7°F | Resp 16

## 2023-11-18 DIAGNOSIS — B349 Viral infection, unspecified: Secondary | ICD-10-CM

## 2023-11-18 LAB — POC COVID19/FLU A&B COMBO
Covid Antigen, POC: NEGATIVE
Influenza A Antigen, POC: NEGATIVE
Influenza B Antigen, POC: NEGATIVE

## 2023-11-18 MED ORDER — BENZONATATE 100 MG PO CAPS: 100.0000 mg | ORAL_CAPSULE | Freq: Three times a day (TID) | ORAL | 0 refills | Status: AC | PRN

## 2023-11-18 MED ORDER — ONDANSETRON 4 MG PO TBDP: 4.0000 mg | ORAL_TABLET | Freq: Three times a day (TID) | ORAL | 0 refills | Status: AC | PRN

## 2023-11-18 NOTE — ED Provider Notes (Signed)
[facility]    CSN: [id number] Arrival date & time: 11/18/23  1357      History   Chief Complaint Chief Complaint  Patient presents with   Fever    Began running high fevers overnight, 103 has been the highest. Chills, body aches, cough, runny nose, headache, and fatigue. Flu A is going around at my place of employment, need to know if I have it as well. - Entered by patient    HPI Meghan Calhoun is a 33 y.o. female.   Patient presents today with 2-day history of fever, Tmax 103 F, body aches and chills, congested cough, shortness of breath with activity, runny and stuffy nose, hoarseness from coughing, headache, abdominal pain, nausea without vomiting, diarrhea, decreased appetite, and fatigue.  She reports 4-5 episodes of purely water 3 stool, denies blood in the stool.  No chest pain, sore throat, ear pain, or vomiting.  Reports that she works at an [employer] and has been exposed to influenza A and her children were sick with similar symptoms last week.  Has taken Tylenol for the fever with mild improvement.  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   Gestational hypertension - medication controlled 07/29/2017   Postpartum care following vaginal delivery (10/2) 07/28/2017   First degree perineal laceration during delivery 07/28/2017   Encounter for planned induction of labor 07/27/2017   Pregnancy 08/21/2015    Past Surgical History:  Procedure Laterality Date   ELBOW SURGERY     MOUTH SURGERY      OB History     Gravida  2   Para  2   Term  2   Preterm  0   AB  0   Living  2      SAB  0   IAB  0   Ectopic  0   Multiple  0    Live Births  2            Home Medications    Prior to Admission medications   Medication Sig Start Date End Date Taking? Authorizing Provider  benzonatate (TESSALON) 100 MG capsule Take 1 capsule (100 mg total) by mouth 3 (three) times daily as needed for cough. Do not take with alcohol or while operating or driving heavy machinery 2/53/66  Yes Cathlean Marseilles A, NP  ondansetron (ZOFRAN-ODT) 4 MG disintegrating tablet Take 1 tablet (4 mg total) by mouth every 8 (eight) hours as needed for vomiting or nausea. 11/18/23  Yes Valentino Nose, NP  acetaminophen (TYLENOL) 325 MG tablet Take 650 mg by mouth every 6 (six) hours as needed for fever.    [provider]  fluticasone (FLONASE) 50 MCG/ACT nasal spray Place 1 spray into both nostrils daily. 07/15/21   Particia Nearing, PA-C  lidocaine (XYLOCAINE) 2 % solution Use as directed 15 mLs in the mouth or throat as needed for mouth pain. 10/02/21   Daphine Deutscher, Mary-Margaret, FNP  LORazepam (ATIVAN) 1 MG tablet Take 1 tablet (1 mg total) by mouth every 6 (six) hours as needed for anxiety. 09/04/18   Zadie Rhine, MD    Family  History Family History  Problem Relation Age of Onset   Hypertension Mother    Anxiety disorder Sister     Social History Social History   Tobacco Use   Smoking status: Never   Smokeless tobacco: Never  Substance Use Topics   Alcohol use: No    Comment: occas   Drug use: No     Allergies   Patient has no known allergies.   Review of Systems Review of Systems Per HPI  Physical Exam Triage Vital Signs ED Triage Vitals  Encounter Vitals Group     BP 11/18/23 1418 (!) 152/104     Systolic BP Percentile --      Diastolic BP Percentile --      Pulse Rate 11/18/23 1418 (!) 130     Resp 11/18/23 1418 16     Temp 11/18/23 1418 98.7 F (37.1 C)     Temp Source 11/18/23 1418 Oral     SpO2 11/18/23 1418 98 %     Weight --      Height --      Head Circumference --      Peak Flow  --      Pain Score 11/18/23 1421 3     Pain Loc --      Pain Education --      Exclude from Growth Chart --    No data found.  Updated Vital Signs BP (!) 152/104 (BP Location: Right Arm)   Pulse (!) 130   Temp 98.7 F (37.1 C) (Oral)   Resp 16   SpO2 98%   Visual Acuity Right Eye Distance:   Left Eye Distance:   Bilateral Distance:    Right Eye Near:   Left Eye Near:    Bilateral Near:     Physical Exam Vitals and nursing note reviewed.  Constitutional:      General: She is not in acute distress.    Appearance: Normal appearance. She is ill-appearing. She is not toxic-appearing.  HENT:     Head: Normocephalic and atraumatic.     Right Ear: Tympanic membrane, ear canal and external ear normal.     Left Ear: Tympanic membrane, ear canal and external ear normal.     Nose: Congestion present. No rhinorrhea.     Mouth/Throat:     Mouth: Mucous membranes are moist.     Pharynx: Oropharynx is clear. No oropharyngeal exudate or posterior oropharyngeal erythema.  Eyes:     General: No scleral icterus.    Extraocular Movements: Extraocular movements intact.  Cardiovascular:     Rate and Rhythm: Regular rhythm. Tachycardia present.  Pulmonary:     Effort: Pulmonary effort is normal. No respiratory distress.     Breath sounds: Normal breath sounds. No wheezing, rhonchi or rales.  Abdominal:     General: Abdomen is flat. Bowel sounds are normal. There is no distension.     Palpations: Abdomen is soft.     Tenderness: There is no abdominal tenderness. There is no right CVA tenderness, left CVA tenderness, guarding or rebound.  Musculoskeletal:     Cervical back: Normal range of motion and neck supple.  Lymphadenopathy:     Cervical: Cervical adenopathy present.  Skin:    General: Skin is warm and dry.     Coloration: Skin is not jaundiced or pale.     Findings: No erythema or rash.  Neurological:     Mental Status: She is alert and oriented to person, place, and time.  Psychiatric:        Behavior: Behavior is cooperative.      UC Treatments / Results  Labs (all labs ordered are listed, but only abnormal results are displayed) Labs Reviewed  POC COVID19/FLU A&B COMBO    EKG   Radiology No results found.  Procedures Procedures (including critical care time)  Medications Ordered in UC Medications - No data to display  Initial Impression / Assessment and Plan / UC Course  I have reviewed the triage vital signs and the nursing notes.  Pertinent labs & imaging results that were available during my care of the patient were reviewed by me and considered in my medical decision making (see chart for details).   Patient is mildly hypertensive and tachycardic in triage, otherwise vital signs are stable.  Patient appears ill but is nontoxic.  1. Viral illness Suspect viral etiology Vitals and exam are reassuring COVID-19 and influenza test is negative Supportive care discussed ER and return precautions discussed Work excuse provided  The patient was given the opportunity to ask questions.  All questions answered to their satisfaction.  The patient is in agreement to this plan.    Final Clinical Impressions(s) / UC Diagnoses   Final diagnoses:  Viral illness     Discharge Instructions      You have a viral upper respiratory infection.  Symptoms should improve over the next week to 10 days.  If you develop chest pain or shortness of breath, go to the emergency room.  COVID-19 and influenza test is negative today.  Some things that can make you feel better are: - Increased rest - Increasing fluid with water/sugar free electrolytes - Acetaminophen and ibuprofen as needed for fever/pain - Salt water gargling, chloraseptic spray and throat lozenges - OTC guaifenesin (Mucinex) 600 mg twice daily for congestion - Saline sinus flushes or a neti pot - Humidifying the air -Tessalon Perles every 8 hours as needed for dry cough  - Zofran as  needed for nausea/vomiting     ED Prescriptions     Medication Sig Dispense Auth. Provider   benzonatate (TESSALON) 100 MG capsule Take 1 capsule (100 mg total) by mouth 3 (three) times daily as needed for cough. Do not take with alcohol or while operating or driving heavy machinery 21 capsule Cathlean Marseilles A, NP   ondansetron (ZOFRAN-ODT) 4 MG disintegrating tablet Take 1 tablet (4 mg total) by mouth every 8 (eight) hours as needed for vomiting or nausea. 20 tablet Valentino Nose, NP      PDMP not reviewed this encounter.   Valentino Nose, NP 11/18/23 1520

## 2023-11-18 NOTE — Discharge Instructions (Signed)
You have a viral upper respiratory infection.  Symptoms should improve over the next week to 10 days.  If you develop chest pain or shortness of breath, go to the emergency room.  COVID-19 and influenza test is negative today.  Some things that can make you feel better are: - Increased rest - Increasing fluid with water/sugar free electrolytes - Acetaminophen and ibuprofen as needed for fever/pain - Salt water gargling, chloraseptic spray and throat lozenges - OTC guaifenesin (Mucinex) 600 mg twice daily for congestion - Saline sinus flushes or a neti pot - Humidifying the air -Tessalon Perles every 8 hours as needed for dry cough  - Zofran as needed for nausea/vomiting

## 2023-11-18 NOTE — ED Triage Notes (Signed)
Pt reports fever, cough, congestion, body aches x 2 days
